# Patient Record
Sex: Female | Born: 1990 | Race: Black or African American | Hispanic: No | Marital: Single | Smoking: Never smoker
Health system: Southern US, Community
[De-identification: ages and names within clinical notes are randomized; demographics above are authoritative.]

## PROBLEM LIST (undated history)

## (undated) DIAGNOSIS — I10 Essential (primary) hypertension: Secondary | ICD-10-CM

---
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
## 2012-11-26 ENCOUNTER — Encounter (HOSPITAL_COMMUNITY): Payer: Self-pay | Admitting: Emergency Medicine

## 2012-11-26 ENCOUNTER — Emergency Department (HOSPITAL_COMMUNITY)
Admission: EM | Admit: 2012-11-26 | Discharge: 2012-11-26 | Disposition: A | Discharge status: Home / Self Care | Payer: BC Managed Care – PPO | Attending: Emergency Medicine | Admitting: Emergency Medicine

## 2012-11-26 DIAGNOSIS — R6883 Chills (without fever): Secondary | ICD-10-CM | POA: Insufficient documentation

## 2012-11-26 DIAGNOSIS — J029 Acute pharyngitis, unspecified: Secondary | ICD-10-CM | POA: Insufficient documentation

## 2012-11-26 DIAGNOSIS — R51 Headache: Secondary | ICD-10-CM | POA: Insufficient documentation

## 2012-11-26 DIAGNOSIS — L299 Pruritus, unspecified: Secondary | ICD-10-CM | POA: Insufficient documentation

## 2012-11-26 DIAGNOSIS — J3489 Other specified disorders of nose and nasal sinuses: Secondary | ICD-10-CM | POA: Insufficient documentation

## 2012-11-26 DIAGNOSIS — R21 Rash and other nonspecific skin eruption: Secondary | ICD-10-CM | POA: Insufficient documentation

## 2012-11-26 MED ORDER — DIPHENHYDRAMINE HCL 25 MG PO CAPS
25.0000 mg | ORAL_CAPSULE | Freq: Once | ORAL | Status: AC
  Administered 2012-11-26: 25 mg via ORAL
  Filled 2012-11-26: qty 1

## 2012-11-26 MED ORDER — PREDNISONE 20 MG PO TABS
60.0000 mg | ORAL_TABLET | Freq: Once | ORAL | Status: AC
  Administered 2012-11-26: 60 mg via ORAL
  Filled 2012-11-26: qty 3

## 2012-11-26 MED ORDER — DIPHENHYDRAMINE HCL 25 MG PO TABS: 25.0000 mg | ORAL_TABLET | Freq: Four times a day (QID) | ORAL | Status: DC

## 2012-11-26 MED ORDER — PERMETHRIN 5 % EX CREA: TOPICAL_CREAM | CUTANEOUS | Status: DC

## 2012-11-26 MED ORDER — HYDROCORTISONE 1 % EX CREA: TOPICAL_CREAM | CUTANEOUS | Status: DC

## 2012-11-26 NOTE — ED Notes (Signed)
PT. REPORTS RASHES AT INNER THIGHS AND FEET ONSET LAST Monday. ALSO REPORTS HEADACHE AND NASAL CONGESTION .

## 2012-11-26 NOTE — ED Provider Notes (Signed)
History     CSN: [id number]  Arrival date & time 11/26/12  4098   First MD Initiated Contact with Patient 11/26/12 0515      Chief Complaint  Patient presents with  . Rash   HPI  History provided by the patient. Patient is 22 year old female with no significant PMH who presents with concerns for persistent pruritic rash to lower extremities below some feelings of mild headache and chills. Patient reports having some symptoms of nasal congestion, rhinorrhea slight sore throat last week. She's been taking Sudafed for the past 5-6 days and reports improvement of symptoms. On Monday 2 days ago she developed a pruritic rash around her ankles and feet as well as her medial thighs and groin area. She is not use any treatments for the rash and symptoms have been persistent. She does report that the rash feels more pruritic at night. No other members in the household have similar symptoms. She is not traveling recently. She has recently changed somebody soap but this is the only area affected with rash. She does report visiting a friend's house the day before rash symptoms began. She is unsure if anyone at that house had any symptoms. She denies any fever. Denies any neck pain or stiffness. No other aggravating or alleviating factors. No other associated symptoms.    History reviewed. No pertinent past medical history.  History reviewed. No pertinent past surgical history.  No family history on file.  History  Substance Use Topics  . Smoking status: Never Smoker   . Smokeless tobacco: Not on file  . Alcohol Use: Yes    OB History   Grav Para Term Preterm Abortions TAB SAB Ect Mult Living                  Review of Systems  Constitutional: Positive for chills. Negative for fever.  HENT: Positive for congestion, sore throat and rhinorrhea. Negative for neck pain and neck stiffness.   Respiratory: Negative for cough.   Gastrointestinal: Negative for nausea, vomiting, abdominal pain and  diarrhea.  Skin: Positive for rash.  Neurological: Positive for headaches.  All other systems reviewed and are negative.    Allergies  Review of patient's allergies indicates no known allergies.  Home Medications  No current outpatient prescriptions on file.  BP 144/99  Pulse 79  Temp(Src) 98.2 F (36.8 C) (Oral)  Resp 14  SpO2 97%  LMP 11/23/2012  Physical Exam  Nursing note and vitals reviewed. Constitutional: She is oriented to person, place, and time. She appears well-developed and well-nourished. No distress.  HENT:  Head: Normocephalic.  Mouth/Throat: Oropharynx is clear and moist.  Eyes: Conjunctivae are normal.  Neck: Normal range of motion. Neck supple.  Cardiovascular: Normal rate and regular rhythm.   Pulmonary/Chest: Effort normal and breath sounds normal. No stridor. No respiratory distress. She has no wheezes. She has no rales.  Abdominal: Soft.  Musculoskeletal: Normal range of motion. She exhibits no edema.  Lymphadenopathy:    She has no cervical adenopathy.  Neurological: She is alert and oriented to person, place, and time.  Skin: Skin is warm and dry. No rash noted.  Erythematous papular rash to the bilateral lower legs and dorsal feet. Few similar spots up to the legs. Have your rash towards the medial aspects of bilateral thighs. No large areas of erythema or swelling. No induration. No erythematous streaks.  Remaining skin appears normal. No rash to the soles or palms.  Psychiatric: She has a normal mood  and affect. Her behavior is normal.    ED Course  Procedures     1. Rash       5:20 AM patient seen and evaluated. Patient appears well in no acute distress. She does not appear severely ill or toxic. No meningeal signs.        Angus Seller, PA-C 11/26/12 [phone]

## 2012-11-26 NOTE — ED Provider Notes (Signed)
Medical screening examination/treatment/procedure(s) were performed by non-physician practitioner and as supervising physician I was immediately available for consultation/collaboration.  Sunnie Nielsen, MD 11/26/12 [phone]

## 2013-06-06 ENCOUNTER — Emergency Department (HOSPITAL_COMMUNITY)
Admission: EM | Admit: 2013-06-06 | Discharge: 2013-06-06 | Disposition: A | Discharge status: Home / Self Care | Payer: BC Managed Care – PPO | Attending: Emergency Medicine | Admitting: Emergency Medicine

## 2013-06-06 ENCOUNTER — Encounter (HOSPITAL_COMMUNITY): Payer: Self-pay | Admitting: *Deleted

## 2013-06-06 ENCOUNTER — Emergency Department (HOSPITAL_COMMUNITY): Payer: BC Managed Care – PPO

## 2013-06-06 DIAGNOSIS — R071 Chest pain on breathing: Secondary | ICD-10-CM | POA: Insufficient documentation

## 2013-06-06 DIAGNOSIS — R079 Chest pain, unspecified: Secondary | ICD-10-CM

## 2013-06-06 DIAGNOSIS — F17200 Nicotine dependence, unspecified, uncomplicated: Secondary | ICD-10-CM | POA: Insufficient documentation

## 2013-06-06 LAB — POCT I-STAT, CHEM 8
BUN: 12 mg/dL (ref 6–23)
Calcium, Ion: 1.21 mmol/L (ref 1.12–1.23)
Chloride: 101 meq/L (ref 96–112)
Creatinine, Ser: 1.1 mg/dL (ref 0.50–1.10)
Glucose, Bld: 93 mg/dL (ref 70–99)
HCT: 37 % (ref 36.0–46.0)
Hemoglobin: 12.6 g/dL (ref 12.0–15.0)
Potassium: 3.6 meq/L (ref 3.5–5.1)
Sodium: 141 meq/L (ref 135–145)
TCO2: 27 mmol/L (ref 0–100)

## 2013-06-06 LAB — CBC
Hemoglobin: 12.1 g/dL (ref 12.0–15.0)
MCH: 30.3 pg (ref 26.0–34.0)
MCHC: 33.8 g/dL (ref 30.0–36.0)
RDW: 12.6 % (ref 11.5–15.5)

## 2013-06-06 LAB — TROPONIN I: Troponin I: <0.3 ng/mL (ref <0.30)

## 2013-06-06 MED ORDER — KETOROLAC TROMETHAMINE 30 MG/ML IJ SOLN
30.0000 mg | Freq: Once | INTRAMUSCULAR | Status: AC
  Administered 2013-06-06: 30 mg via INTRAVENOUS
  Filled 2013-06-06: qty 1

## 2013-06-06 MED ORDER — IBUPROFEN 800 MG PO TABS: 800.0000 mg | ORAL_TABLET | Freq: Three times a day (TID) | ORAL | Status: DC

## 2013-06-06 NOTE — ED Provider Notes (Signed)
CSN: [id number]     Arrival date & time 06/06/13  0032 History   First MD Initiated Contact with Patient 06/06/13 0100     Chief Complaint  Patient presents with  . Chest Pain   (Consider location/radiation/quality/duration/timing/severity/associated sxs/prior Treatment) Patient is a 22 y.o. female presenting with chest pain.  Chest Pain Associated symptoms: no abdominal pain, no back pain, no fever, no headache, no numbness, no shortness of breath and no weakness    History provided by patient. Woke up around 10:30 PM with right-sided chest pain, mild to moderate severity, constant, dull in quality. No associated diaphoresis, shortness of breath, nausea, leg pain or leg swelling. She denies any recalled trauma but she does lift heavy boxes at work. No known aggravating or alleviating factors. She is a smoker and admits to marijuana use. No history of DVT or PE. She is not on birth control pills. No history of same. At this time she declines any pain medications. Pain is minimal. She initially had some radiation of pain into her right arm but denies any this time. No back pain. No cough, fevers or recent illness.  History reviewed. No pertinent past medical history. History reviewed. No pertinent past surgical history. History reviewed. No pertinent family history. History  Substance Use Topics  . Smoking status: Current Some Day Smoker  . Smokeless tobacco: Not on file  . Alcohol Use: Yes   OB History   Grav Para Term Preterm Abortions TAB SAB Ect Mult Living                 Review of Systems  Constitutional: Negative for fever and chills.  HENT: Negative for neck pain and neck stiffness.   Respiratory: Negative for shortness of breath.   Cardiovascular: Positive for chest pain.  Gastrointestinal: Negative for abdominal pain.  Genitourinary: Negative for flank pain.  Musculoskeletal: Negative for back pain.  Skin: Negative for rash.  Neurological: Negative for weakness, numbness  and headaches.  All other systems reviewed and are negative.    Allergies  Review of patient's allergies indicates no known allergies.  Home Medications   Current Outpatient Rx  Name  Route  Sig  Dispense  Refill  . diphenhydrAMINE (BENADRYL) 25 MG tablet   Oral   Take 1 tablet (25 mg total) by mouth every 6 (six) hours.   20 tablet   0   . hydrocortisone cream 1 %      Apply to affected area 2 times daily   15 g   0   . permethrin (ELIMITE) 5 % cream      Apply to affected areas from the neck down and leave on the skin for 8-14 hours then wash off once. Do not reapply for 7 days.   60 g   0   . pseudoephedrine (SUDAFED) 30 MG tablet   Oral   Take 30 mg by mouth every 6 (six) hours as needed for congestion.          BP 146/86  Pulse 80  Temp(Src) 98.6 F (37 C) (Oral)  Resp 18  SpO2 99%  LMP 06/03/2013 Physical Exam  Constitutional: She is oriented to person, place, and time. She appears well-developed and well-nourished.  HENT:  Head: Normocephalic and atraumatic.  Eyes: EOM are normal. Pupils are equal, round, and reactive to light.  Neck: Neck supple.  Cardiovascular: Normal rate, regular rhythm and intact distal pulses.   Pulmonary/Chest: Effort normal and breath sounds normal. No respiratory distress.  She exhibits no tenderness.  Localizes pain to R anterior chest wall, no rash or crepitus, no reproducible tenderness  Abdominal: Soft. There is no tenderness.  Musculoskeletal: Normal range of motion. She exhibits no edema.  Neurological: She is alert and oriented to person, place, and time.  Skin: Skin is warm and dry.    ED Course  Procedures (including critical care time) Labs Review Labs Reviewed  CBC - Abnormal; Notable for the following:    HCT 35.8 (*)    All other components within normal limits  TROPONIN I  D-DIMER, QUANTITATIVE  POCT I-STAT, CHEM 8   Imaging Review Dg Chest 2 View  06/06/2013   CLINICAL DATA:  Right-sided chest  pain.  EXAM: CHEST  2 VIEW  COMPARISON:  None.  FINDINGS: The heart size and mediastinal contours are within normal limits. Both lungs are clear. The visualized skeletal structures are unremarkable.  IMPRESSION: No active cardiopulmonary disease.   Electronically Signed   By: Tiburcio Pea M.D.   On: 06/06/2013 01:23     Date: 06/06/2013  Rate: 78  Rhythm: normal sinus rhythm  QRS Axis: normal  Intervals: normal  ST/T Wave abnormalities: nonspecific ST changes  Conduction Disutrbances:none  Narrative Interpretation:   Old EKG Reviewed: none available  IV Toradol  Pain improving. Plan discharge home with outpatient followup. Prescription for Motrin provided. Return precautions verbalized as understood. patient agrees to return to the emergency department any fevers, trouble breathing or return chest pain  MDM  Diagnosis: Chest pain  EKG, labs, chest x-ray  Improved with medications Vital signs and nursing notes reviewed   Sunnie Nielsen, MD 06/06/13 [phone]

## 2013-06-06 NOTE — ED Notes (Signed)
Patient states that when she awoke today that she had a discomfort in her right chest radiating down her right arm with and without movement. She denies nausea or being diaphoretic, but some nausea associated with pain. Patient denies taking medication for the pain. Pain became progressively worse and she presents to the ED.

## 2013-09-19 ENCOUNTER — Emergency Department (HOSPITAL_COMMUNITY)
Admission: EM | Admit: 2013-09-19 | Discharge: 2013-09-19 | Discharge status: Against Medical Advice | Payer: BC Managed Care – PPO | Attending: Emergency Medicine | Admitting: Emergency Medicine

## 2013-09-19 ENCOUNTER — Encounter (HOSPITAL_COMMUNITY): Payer: Self-pay | Admitting: Emergency Medicine

## 2013-09-19 DIAGNOSIS — F17200 Nicotine dependence, unspecified, uncomplicated: Secondary | ICD-10-CM | POA: Insufficient documentation

## 2013-09-19 DIAGNOSIS — R1110 Vomiting, unspecified: Secondary | ICD-10-CM | POA: Insufficient documentation

## 2013-09-19 NOTE — ED Notes (Signed)
Pt arrived to the ED with a complaint of a headache with emesis.  Pt states she has had influenza type symptoms for a couple of hours.  Pt has diarrhea, emesis and a headache.  Pt had 3 episodes of emesis, and 2 episodes of diarrhea.

## 2014-08-19 ENCOUNTER — Encounter (HOSPITAL_COMMUNITY): Payer: Self-pay

## 2014-08-19 ENCOUNTER — Emergency Department (HOSPITAL_COMMUNITY)
Admission: EM | Admit: 2014-08-19 | Discharge: 2014-08-19 | Disposition: A | Discharge status: Home / Self Care | Payer: BC Managed Care – PPO | Attending: Emergency Medicine | Admitting: Emergency Medicine

## 2014-08-19 DIAGNOSIS — F419 Anxiety disorder, unspecified: Secondary | ICD-10-CM | POA: Diagnosis present

## 2014-08-19 DIAGNOSIS — R000 Tachycardia, unspecified: Secondary | ICD-10-CM | POA: Diagnosis not present

## 2014-08-19 DIAGNOSIS — Z720 Tobacco use: Secondary | ICD-10-CM | POA: Insufficient documentation

## 2014-08-19 DIAGNOSIS — F1210 Cannabis abuse, uncomplicated: Secondary | ICD-10-CM | POA: Diagnosis not present

## 2014-08-19 DIAGNOSIS — F1810 Inhalant abuse, uncomplicated: Secondary | ICD-10-CM

## 2014-08-19 LAB — RAPID URINE DRUG SCREEN, HOSP PERFORMED
Amphetamines: NOT DETECTED
BARBITURATES: NOT DETECTED
BENZODIAZEPINES: NOT DETECTED
COCAINE: NOT DETECTED
OPIATES: NOT DETECTED
Tetrahydrocannabinol: NOT DETECTED

## 2014-08-19 NOTE — ED Provider Notes (Signed)
CSN: [id number]     Arrival date & time 08/19/14  1310 History   First MD Initiated Contact with Patient 08/19/14 1341     Chief Complaint  Patient presents with  . Anxiety    HPI  Ms. Janet Velasquez is a 23 year old female who presents today with agitation. She reports smoking some marijuana she picked up from a new dealer earlier today. About 15 minutes later, she reported feeling agitated and restless, like "she wanted to run." She feels her heart also started beating really fast. Otherwise, she denies recent illness, visual hallucinations, nausea, vomiting, diarrhea, chest pain, shortness of breath, prior episode of similar symptoms, other associated alcohol or drug use.   History reviewed. No pertinent past medical history. History reviewed. No pertinent past surgical history. History reviewed. No pertinent family history. History  Substance Use Topics  . Smoking status: Current Some Day Smoker  . Smokeless tobacco: Not on file  . Alcohol Use: Yes     Comment: social   OB History    No data available     Review of Systems  Respiratory: Negative for shortness of breath.   Cardiovascular: Negative for chest pain.  Gastrointestinal: Negative for nausea, vomiting, abdominal pain and diarrhea.  Psychiatric/Behavioral: Positive for agitation. Negative for hallucinations.      Allergies  Review of patient's allergies indicates no known allergies.  Home Medications   Prior to Admission medications   Not on File   BP 141/90 mmHg  Pulse 98  Temp(Src) 98.1 F (36.7 C) (Oral)  Resp 18  SpO2 99%  LMP 07/27/2014 Physical Exam  Constitutional: She is oriented to person, place, and time. She appears well-developed and well-nourished. No distress.  HENT:  Head: Normocephalic and atraumatic.  Mouth/Throat: Oropharynx is clear and moist. No oropharyngeal exudate.  Eyes: Conjunctivae are normal. Pupils are equal, round, and reactive to light.  Cardiovascular: Normal rate and regular  rhythm.  Exam reveals no gallop and no friction rub.   No murmur heard. Pulmonary/Chest: Effort normal and breath sounds normal. No respiratory distress. She has no wheezes. She has no rales.  Abdominal: Soft. Bowel sounds are normal. She exhibits no distension. There is no tenderness. There is no rebound.  Neurological: She is alert and oriented to person, place, and time. No cranial nerve deficit.  CN II-XII intact, 5/5 upper and lower extremity strength, 2+ grip strength, finger to nose & rapid alternating movements intact    Skin: She is not diaphoretic.    ED Course  Procedures (including critical care time) Labs Review Labs Reviewed  URINE RAPID DRUG SCREEN (HOSP PERFORMED)    Imaging Review No results found.   EKG Interpretation None      MDM   Final diagnoses:  Tachycardia  Abuse of smoked substance    Hemodynamically stable with tachycardia improving. Unsure what she may have smoked as UDS is without marijuana. Advised her to be cautious about substance abuse.     Heywood Ilesushil Patel, MD 08/19/14 1500  Juliet RudeNathan R. Rubin PayorPickering, MD 08/19/14 1526

## 2014-08-19 NOTE — ED Notes (Signed)
Per EMS, Pt, from home, c/o anxiety and "heart racing" after smoking marijuana.  Denies pain.  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

## 2014-11-10 ENCOUNTER — Emergency Department (HOSPITAL_COMMUNITY): Payer: BLUE CROSS/BLUE SHIELD

## 2014-11-10 ENCOUNTER — Emergency Department (HOSPITAL_COMMUNITY)
Admission: EM | Admit: 2014-11-10 | Discharge: 2014-11-10 | Disposition: A | Discharge status: Home / Self Care | Payer: BLUE CROSS/BLUE SHIELD | Attending: Emergency Medicine | Admitting: Emergency Medicine

## 2014-11-10 ENCOUNTER — Encounter (HOSPITAL_COMMUNITY): Payer: Self-pay | Admitting: Emergency Medicine

## 2014-11-10 DIAGNOSIS — M549 Dorsalgia, unspecified: Secondary | ICD-10-CM | POA: Insufficient documentation

## 2014-11-10 DIAGNOSIS — Z87891 Personal history of nicotine dependence: Secondary | ICD-10-CM | POA: Diagnosis not present

## 2014-11-10 DIAGNOSIS — R079 Chest pain, unspecified: Secondary | ICD-10-CM | POA: Diagnosis present

## 2014-11-10 LAB — COMPREHENSIVE METABOLIC PANEL
ALT: 13 U/L (ref 0–35)
ANION GAP: 4 — AB (ref 5–15)
AST: 16 U/L (ref 0–37)
Albumin: 4 g/dL (ref 3.5–5.2)
Alkaline Phosphatase: 42 U/L (ref 39–117)
BUN: 12 mg/dL (ref 6–23)
CHLORIDE: 103 mmol/L (ref 96–112)
CO2: 27 mmol/L (ref 19–32)
Calcium: 8.4 mg/dL (ref 8.4–10.5)
Creatinine, Ser: 0.73 mg/dL (ref 0.50–1.10)
GFR calc Af Amer: >90 mL/min (ref >90)
GFR calc non Af Amer: >90 mL/min (ref >90)
Glucose, Bld: 93 mg/dL (ref 70–99)
POTASSIUM: 3.7 mmol/L (ref 3.5–5.1)
SODIUM: 134 mmol/L — AB (ref 135–145)
Total Bilirubin: 1 mg/dL (ref 0.3–1.2)
Total Protein: 7.3 g/dL (ref 6.0–8.3)

## 2014-11-10 LAB — CBC WITH DIFFERENTIAL/PLATELET
BASOS PCT: 0 % (ref 0–1)
Basophils Absolute: 0 10*3/uL (ref 0.0–0.1)
EOS ABS: 0.1 10*3/uL (ref 0.0–0.7)
Eosinophils Relative: 1 % (ref 0–5)
HEMATOCRIT: 36.2 % (ref 36.0–46.0)
HEMOGLOBIN: 12.2 g/dL (ref 12.0–15.0)
Lymphocytes Relative: 37 % (ref 12–46)
Lymphs Abs: 3.1 10*3/uL (ref 0.7–4.0)
MCH: 31 pg (ref 26.0–34.0)
MCHC: 33.7 g/dL (ref 30.0–36.0)
MCV: 92.1 fL (ref 78.0–100.0)
Monocytes Absolute: 0.7 10*3/uL (ref 0.1–1.0)
Monocytes Relative: 8 % (ref 3–12)
NEUTROS ABS: 4.4 10*3/uL (ref 1.7–7.7)
NEUTROS PCT: 54 % (ref 43–77)
Platelets: 269 10*3/uL (ref 150–400)
RBC: 3.93 MIL/uL (ref 3.87–5.11)
RDW: 13.4 % (ref 11.5–15.5)
WBC: 8.2 10*3/uL (ref 4.0–10.5)

## 2014-11-10 LAB — D-DIMER, QUANTITATIVE (NOT AT ARMC)

## 2014-11-10 LAB — TROPONIN I: Troponin I: <0.03 ng/mL (ref <0.031)

## 2014-11-10 LAB — HCG, SERUM, QUALITATIVE: Preg, Serum: NEGATIVE

## 2014-11-10 MED ORDER — IBUPROFEN 600 MG PO TABS: 600.0000 mg | ORAL_TABLET | Freq: Four times a day (QID) | ORAL | Status: DC | PRN

## 2014-11-10 MED ORDER — KETOROLAC TROMETHAMINE 60 MG/2ML IM SOLN
60.0000 mg | Freq: Once | INTRAMUSCULAR | Status: DC
  Filled 2014-11-10: qty 2

## 2014-11-10 NOTE — ED Notes (Signed)
Went into patients room to discharge patient. Patient was not in room at this time.

## 2014-11-10 NOTE — ED Notes (Signed)
Patient not in room, when this writer went into room to discharge her.

## 2014-11-10 NOTE — Discharge Instructions (Signed)

## 2014-11-10 NOTE — ED Notes (Signed)
Patient states chest pain that comes and goes started yesterday. Describes pain as a dull ache that was reduced with motrin.

## 2014-11-10 NOTE — ED Notes (Addendum)
Pt c/o right upper chest aching onset yesterday while sitting on couch. Pt states pain does radiate to back at times, was unable to get comfortable tonight. Intermittent SHOB. Pt states she took Motrin @ 1800 which relieved the pain but pain returned

## 2014-11-10 NOTE — ED Provider Notes (Signed)
CSN: [id number]     Arrival date & time 11/10/14  7829 History   First MD Initiated Contact with Patient 11/10/14 0345     Chief Complaint  Patient presents with  . Chest Pain     (Consider location/radiation/quality/duration/timing/severity/associated sxs/prior Treatment) HPI Patient presents with intermittent right-sided chest pain for the last several months. Discussed the pain as dull in nature. She's been seen in the emergency department before for similar pain. She denies any shortness of breath. She denies any cough, fever or chills. She's had no lower extremity swelling or pain. No recent surgeries or extended travel. No family history of PE/DVT. Denies any chest trauma. Patient states pain in her right upper chest radiates to her right upper back and to her right upper extremity. No focal weakness or numbness History reviewed. No pertinent past medical history. History reviewed. No pertinent past surgical history. No family history on file. History  Substance Use Topics  . Smoking status: Former Games developer  . Smokeless tobacco: Not on file  . Alcohol Use: Yes     Comment: social   OB History    No data available     Review of Systems  Constitutional: Negative for fever and chills.  Respiratory: Negative for cough and shortness of breath.   Cardiovascular: Positive for chest pain. Negative for palpitations and leg swelling.  Gastrointestinal: Negative for nausea, vomiting, abdominal pain, diarrhea and constipation.  Musculoskeletal: Positive for back pain. Negative for neck pain and neck stiffness.  Skin: Negative for rash and wound.  Neurological: Negative for dizziness, weakness, light-headedness, numbness and headaches.  All other systems reviewed and are negative.     Allergies  Review of patient's allergies indicates no known allergies.  Home Medications   Prior to Admission medications   Medication Sig Start Date End Date Taking? Authorizing Provider  ibuprofen  (ADVIL,MOTRIN) 200 MG tablet Take 200 mg by mouth every 6 (six) hours as needed (for pain).   Yes Historical Provider, MD   BP 142/88 mmHg  Pulse 85  Temp(Src) 98 F (36.7 C) (Oral)  Resp 18  Ht  (1.778 m)  Wt 220 lb (99.791 kg)  BMI 31.57 kg/m2  SpO2 100%  LMP 10/28/2014 Physical Exam  Constitutional: She is oriented to person, place, and time. She appears well-developed and well-nourished. No distress.  HENT:  Head: Normocephalic and atraumatic.  Mouth/Throat: Oropharynx is clear and moist.  Eyes: EOM are normal. Pupils are equal, round, and reactive to light.  Neck: Normal range of motion. Neck supple.  Cardiovascular: Normal rate and regular rhythm.  Exam reveals no gallop and no friction rub.   No murmur heard. Pulmonary/Chest: Effort normal and breath sounds normal. No respiratory distress. She has no wheezes. She has no rales. She exhibits no tenderness.  Abdominal: Soft. Bowel sounds are normal. She exhibits no distension and no mass. There is no tenderness. There is no rebound and no guarding.  Musculoskeletal: Normal range of motion. She exhibits no edema or tenderness.  No thoracic or lumbar tenderness with palpation. Distal pulses intact. No calf swelling or tenderness.  Neurological: She is alert and oriented to person, place, and time.  5/5 motor in all extremities. Sensation fully intact.  Skin: Skin is warm and dry. No rash noted. No erythema.  Psychiatric: She has a normal mood and affect. Her behavior is normal.  Nursing note and vitals reviewed.   ED Course  Procedures (including critical care time) Labs Review Labs Reviewed  CBC WITH  DIFFERENTIAL/PLATELET  COMPREHENSIVE METABOLIC PANEL  TROPONIN I  D-DIMER, QUANTITATIVE  HCG, SERUM, QUALITATIVE    Imaging Review No results found.   EKG Interpretation None      MDM   Final diagnoses:  Chest pain   low suspicion for PE/CAD. EKG without any acute findings. Will treat with NSAIDs and screen  for PE.   Chest pain workup without any acute findings. EKG, troponin, d-dimer and chest x-ray. Patient refused any pain medication. Admits to history of lifting heavy boxes which may be contributing to her symptoms. We'll discharge home with prescription for NSAIDs. Advised heat as needed. Return precautions given.    Loren Raceravid Constance Whittle, MD 11/10/14 [phone]

## 2015-05-18 ENCOUNTER — Emergency Department (HOSPITAL_COMMUNITY)
Admission: EM | Admit: 2015-05-18 | Discharge: 2015-05-18 | Disposition: A | Discharge status: Home / Self Care | Payer: BLUE CROSS/BLUE SHIELD | Attending: Emergency Medicine | Admitting: Emergency Medicine

## 2015-05-18 ENCOUNTER — Encounter (HOSPITAL_COMMUNITY): Payer: Self-pay | Admitting: Emergency Medicine

## 2015-05-18 DIAGNOSIS — R05 Cough: Secondary | ICD-10-CM

## 2015-05-18 DIAGNOSIS — Z87891 Personal history of nicotine dependence: Secondary | ICD-10-CM | POA: Insufficient documentation

## 2015-05-18 DIAGNOSIS — J029 Acute pharyngitis, unspecified: Secondary | ICD-10-CM

## 2015-05-18 DIAGNOSIS — R059 Cough, unspecified: Secondary | ICD-10-CM

## 2015-05-18 LAB — RAPID STREP SCREEN (MED CTR MEBANE ONLY): STREPTOCOCCUS, GROUP A SCREEN (DIRECT): NEGATIVE

## 2015-05-18 MED ORDER — DEXTROMETHORPHAN-MENTHOL 5-5 MG MT LOZG: 1.0000 | LOZENGE | Freq: Three times a day (TID) | OROMUCOSAL | Status: DC

## 2015-05-18 NOTE — Discharge Instructions (Signed)
Your strep test is negative. °

## 2015-05-18 NOTE — ED Notes (Signed)
Pt c/o sore throat x2 days, cough with yellow sputum. Denies fever.

## 2015-05-18 NOTE — ED Provider Notes (Signed)
CSN: [id number]   Arrival date & time 05/18/15 1924  History  This chart was scribed for non-physician practitioner, Earley Favor, NP working with Leta Baptist, MD by Bethel Born, ED Scribe. This patient was seen in room WTR5/WTR5 and the patient's care was started at 8:32 PM.  Chief Complaint  Patient presents with  . Sore Throat  . Cough    HPI The history is provided by the patient. No language interpreter was used.   Janet Velasquez is a 24 y.o. female who presents to the Emergency Department complaining of a constant sore throat with onset 2 days ago. Pt rates the pain 3/10 in severity. OTC throat lozenges provided mild relief PTA. Associated symptoms include cough productive of yellow sputum, nasal congestion, and chest tightness. Pt denies fever. No history of seasonal allergies.   History reviewed. No pertinent past medical history.  History reviewed. No pertinent past surgical history.  No family history on file.  Social History  Substance Use Topics  . Smoking status: Former Games developer  . Smokeless tobacco: None  . Alcohol Use: Yes     Comment: social     Review of Systems  Constitutional: Negative for fever.  HENT: Positive for congestion and sore throat.   Respiratory: Positive for cough and chest tightness.     Home Medications   Prior to Admission medications   Medication Sig Start Date End Date Taking? Authorizing Provider  Dextromethorphan-Menthol (DELSYM COUGH+ SOOTHING ACTION) 5-5 MG LOZG Use as directed 1 lozenge in the mouth or throat 3 (three) times daily. 05/18/15   Earley Favor, NP  ibuprofen (ADVIL,MOTRIN) 600 MG tablet Take 1 tablet (600 mg total) by mouth every 6 (six) hours as needed for moderate pain (for pain). 11/10/14   Loren Racer, MD    Allergies  Review of patient's allergies indicates no known allergies.  Triage Vitals: BP 145/90 mmHg  Pulse 77  Temp(Src) 98.1 F (36.7 C) (Oral)  Resp 20  SpO2 99%  LMP 05/01/2015  Physical Exam   Constitutional: She appears well-developed and well-nourished.  HENT:  Head: Normocephalic and atraumatic.  Right Ear: External ear normal.  Left Ear: External ear normal.  Eyes: Pupils are equal, round, and reactive to light.  Cardiovascular: Normal rate and regular rhythm.   Pulmonary/Chest: Effort normal and breath sounds normal. No respiratory distress. She has no wheezes. She has no rales. She exhibits no tenderness.  Musculoskeletal: Normal range of motion.  Lymphadenopathy:    She has no cervical adenopathy.  Neurological: She is alert.  Skin: Skin is warm and dry.  Nursing note and vitals reviewed.   ED Course  Procedures  DIAGNOSTIC STUDIES: Oxygen Saturation is 99% on RA,  normal by my interpretation.    COORDINATION OF CARE: 8:35 PM Discussed treatment plan which includes strep screen and supportive care with pt at bedside and pt agreed to plan.  Labs Review-  Labs Reviewed  RAPID STREP SCREEN (NOT AT [facility])  CULTURE, GROUP A STREP    Imaging Review No results found.     Strep test is negative MDM   Final diagnoses:  Pharyngitis  Cough    I personally performed the services described in this documentation, which was scribed in my presence. The recorded information has been reviewed and is accurate.     Earley Favor, NP 05/18/15 2130  Leta Baptist, MD 05/20/15 [phone]

## 2015-05-20 LAB — CULTURE, GROUP A STREP

## 2015-09-02 ENCOUNTER — Emergency Department (HOSPITAL_COMMUNITY)
Admission: EM | Admit: 2015-09-02 | Discharge: 2015-09-02 | Disposition: A | Discharge status: Home / Self Care | Payer: Managed Care, Other (non HMO) | Attending: Emergency Medicine | Admitting: Emergency Medicine

## 2015-09-02 ENCOUNTER — Emergency Department (HOSPITAL_COMMUNITY): Payer: Managed Care, Other (non HMO)

## 2015-09-02 DIAGNOSIS — Z87891 Personal history of nicotine dependence: Secondary | ICD-10-CM | POA: Insufficient documentation

## 2015-09-02 DIAGNOSIS — Z3202 Encounter for pregnancy test, result negative: Secondary | ICD-10-CM | POA: Insufficient documentation

## 2015-09-02 DIAGNOSIS — R0602 Shortness of breath: Secondary | ICD-10-CM | POA: Insufficient documentation

## 2015-09-02 DIAGNOSIS — M25511 Pain in right shoulder: Secondary | ICD-10-CM | POA: Insufficient documentation

## 2015-09-02 DIAGNOSIS — R079 Chest pain, unspecified: Secondary | ICD-10-CM | POA: Insufficient documentation

## 2015-09-02 LAB — PREGNANCY, URINE: Preg Test, Ur: NEGATIVE

## 2015-09-02 LAB — CBC
HCT: 36.3 % (ref 36.0–46.0)
Hemoglobin: 12.3 g/dL (ref 12.0–15.0)
MCH: 30.4 pg (ref 26.0–34.0)
MCHC: 33.9 g/dL (ref 30.0–36.0)
MCV: 89.9 fL (ref 78.0–100.0)
Platelets: 317 10*3/uL (ref 150–400)
RBC: 4.04 MIL/uL (ref 3.87–5.11)
RDW: 13 % (ref 11.5–15.5)
WBC: 9.1 10*3/uL (ref 4.0–10.5)

## 2015-09-02 LAB — BASIC METABOLIC PANEL
Anion gap: 10 (ref 5–15)
BUN: 8 mg/dL (ref 6–20)
CO2: 25 mmol/L (ref 22–32)
Calcium: 9.1 mg/dL (ref 8.9–10.3)
Chloride: 107 mmol/L (ref 101–111)
Creatinine, Ser: 0.9 mg/dL (ref 0.44–1.00)
GFR calc Af Amer: >60 mL/min (ref >60)
GFR calc non Af Amer: >60 mL/min (ref >60)
Glucose, Bld: 107 mg/dL — ABNORMAL HIGH (ref 65–99)
Potassium: 3.3 mmol/L — ABNORMAL LOW (ref 3.5–5.1)
Sodium: 142 mmol/L (ref 135–145)

## 2015-09-02 LAB — I-STAT TROPONIN, ED: Troponin i, poc: 0 ng/mL (ref 0.00–0.08)

## 2015-09-02 LAB — D-DIMER, QUANTITATIVE (NOT AT ARMC): D-Dimer, Quant: <0.27 ug/mL-FEU (ref 0.00–0.50)

## 2015-09-02 MED ORDER — POTASSIUM CHLORIDE CRYS ER 20 MEQ PO TBCR
40.0000 meq | EXTENDED_RELEASE_TABLET | Freq: Once | ORAL | Status: AC
  Administered 2015-09-02: 40 meq via ORAL
  Filled 2015-09-02: qty 2

## 2015-09-02 MED ORDER — KETOROLAC TROMETHAMINE 15 MG/ML IJ SOLN
15.0000 mg | Freq: Once | INTRAMUSCULAR | Status: AC
  Administered 2015-09-02: 15 mg via INTRAVENOUS
  Filled 2015-09-02: qty 1

## 2015-09-02 NOTE — ED Notes (Signed)
Patient transported to X-ray 

## 2015-09-02 NOTE — ED Notes (Signed)
Pt requesting a CT scan.  Pt informed that we typically do not perform CTs, due to the radiation unless other testing suggests that one is needed.

## 2015-09-02 NOTE — ED Notes (Signed)
Pt unable to be found when this RN went in to discharge her.  It is assumed that the Pt left prior to being discharged.

## 2015-09-02 NOTE — ED Notes (Signed)
Pt c/o intermittent right sided chest pain and SOB radiating to right scapular area. Seen for the same in past, no dx. States pain is worse today than in past. Endorses nausea.

## 2015-09-02 NOTE — Discharge Instructions (Signed)
Nonspecific Chest Pain  °Chest pain can be caused by many different conditions. There is always a chance that your pain could be related to something serious, such as a heart attack or a blood clot in your lungs. Chest pain can also be caused by conditions that are not life-threatening. If you have chest pain, it is very important to follow up with your health care provider. °CAUSES  °Chest pain can be caused by: °· Heartburn. °· Pneumonia or bronchitis. °· Anxiety or stress. °· Inflammation around your heart (pericarditis) or lung (pleuritis or pleurisy). °· A blood clot in your lung. °· A collapsed lung (pneumothorax). It can develop suddenly on its own (spontaneous pneumothorax) or from trauma to the chest. °· Shingles infection (varicella-zoster virus). °· Heart attack. °· Damage to the bones, muscles, and cartilage that make up your chest wall. This can include: °¨ Bruised bones due to injury. °¨ Strained muscles or cartilage due to frequent or repeated coughing or overwork. °¨ Fracture to one or more ribs. °¨ Sore cartilage due to inflammation (costochondritis). °RISK FACTORS  °Risk factors for chest pain may include: °· Activities that increase your risk for trauma or injury to your chest. °· Respiratory infections or conditions that cause frequent coughing. °· Medical conditions or overeating that can cause heartburn. °· Heart disease or family history of heart disease. °· Conditions or health behaviors that increase your risk of developing a blood clot. °· Having had chicken pox (varicella zoster). °SIGNS AND SYMPTOMS °Chest pain can feel like: °· Burning or tingling on the surface of your chest or deep in your chest. °· Crushing, pressure, aching, or squeezing pain. °· Dull or sharp pain that is worse when you move, cough, or take a deep breath. °· Pain that is also felt in your back, neck, shoulder, or arm, or pain that spreads to any of these areas. °Your chest pain may come and go, or it may stay  constant. °DIAGNOSIS °Lab tests or other studies may be needed to find the cause of your pain. Your health care provider may have you take a test called an ambulatory ECG (electrocardiogram). An ECG records your heartbeat patterns at the time the test is performed. You may also have other tests, such as: °· Transthoracic echocardiogram (TTE). During echocardiography, sound waves are used to create a picture of all of the heart structures and to look at how blood flows through your heart. °· Transesophageal echocardiogram (TEE). This is a more advanced imaging test that obtains images from inside your body. It allows your health care provider to see your heart in finer detail. °· Cardiac monitoring. This allows your health care provider to monitor your heart rate and rhythm in real time. °· Holter monitor. This is a portable device that records your heartbeat and can help to diagnose abnormal heartbeats. It allows your health care provider to track your heart activity for several days, if needed. °· Stress tests. These can be done through exercise or by taking medicine that makes your heart beat more quickly. °· Blood tests. °· Imaging tests. °TREATMENT  °Your treatment depends on what is causing your chest pain. Treatment may include: °· Medicines. These may include: °¨ Acid blockers for heartburn. °¨ Anti-inflammatory medicine. °¨ Pain medicine for inflammatory conditions. °¨ Antibiotic medicine, if an infection is present. °¨ Medicines to dissolve blood clots. °¨ Medicines to treat coronary artery disease. °· Supportive care for conditions that do not require medicines. This may include: °¨ Resting. °¨ Applying heat   or cold packs to injured areas. °¨ Limiting activities until pain decreases. °HOME CARE INSTRUCTIONS °· If you were prescribed an antibiotic medicine, finish it all even if you start to feel better. °· Avoid any activities that bring on chest pain. °· Do not use any tobacco products, including  cigarettes, chewing tobacco, or electronic cigarettes. If you need help quitting, ask your health care provider. °· Do not drink alcohol. °· Take medicines only as directed by your health care provider. °· Keep all follow-up visits as directed by your health care provider. This is important. This includes any further testing if your chest pain does not go away. °· If heartburn is the cause for your chest pain, you may be told to keep your head raised (elevated) while sleeping. This reduces the chance that acid will go from your stomach into your esophagus. °· Make lifestyle changes as directed by your health care provider. These may include: °¨ Getting regular exercise. Ask your health care provider to suggest some activities that are safe for you. °¨ Eating a heart-healthy diet. A registered dietitian can help you to learn healthy eating options. °¨ Maintaining a healthy weight. °¨ Managing diabetes, if necessary. °¨ Reducing stress. °SEEK MEDICAL CARE IF: °· Your chest pain does not go away after treatment. °· You have a rash with blisters on your chest. °· You have a fever. °SEEK IMMEDIATE MEDICAL CARE IF:  °· Your chest pain is worse. °· You have an increasing cough, or you cough up blood. °· You have severe abdominal pain. °· You have severe weakness. °· You faint. °· You have chills. °· You have sudden, unexplained chest discomfort. °· You have sudden, unexplained discomfort in your arms, back, neck, or jaw. °· You have shortness of breath at any time. °· You suddenly start to sweat, or your skin gets clammy. °· You feel nauseous or you vomit. °· You suddenly feel light-headed or dizzy. °· Your heart begins to beat quickly, or it feels like it is skipping beats. °These symptoms may represent a serious problem that is an emergency. Do not wait to see if the symptoms will go away. Get medical help right away. Call your local emergency services (911 in the [place] Do not drive yourself to the hospital. °  °This  information is not intended to replace advice given to you by your health care provider. Make sure you discuss any questions you have with your health care provider. °  °Document Released: 05/30/2005 Document Revised: 09/10/2014 Document Reviewed: 03/26/2014 °Elsevier Interactive Patient Education ©2016 [employer] Inc[employer] ° °

## 2015-09-02 NOTE — ED Provider Notes (Signed)
CSN: [id number]     Arrival date & time 09/02/15  [id number] History   First MD Initiated Contact with Patient 09/02/15 0902     Chief Complaint  Patient presents with  . Chest Pain     (Consider location/radiation/quality/duration/timing/severity/associated sxs/prior Treatment) HPI   24 year old female with chest pain. Right sided. Has been intermittent for the past several months. Again having pain today and somewhat stronger in intensity. Pain is worse with range of motion right shoulder. Sometimes feels short of breath with exertion such as walking up steps. Dyspnea doesn't necessarily correspond to episodes of pain. No fevers or chills. No cough. No unusual leg pain or swelling. No past history of DVT/PE.  No past medical history on file. No past surgical history on file. No family history on file. Social History  Substance Use Topics  . Smoking status: Former Games developermoker  . Smokeless tobacco: Not on file  . Alcohol Use: Yes     Comment: social   OB History    No data available     Review of Systems  All systems reviewed and negative, other than as noted in HPI.   Allergies  Review of patient's allergies indicates no known allergies.  Home Medications   Prior to Admission medications   Medication Sig Start Date End Date Taking? Authorizing Provider  Dextromethorphan-Menthol (DELSYM COUGH+ SOOTHING ACTION) 5-5 MG LOZG Use as directed 1 lozenge in the mouth or throat 3 (three) times daily. Patient not taking: Reported on 09/02/2015 05/18/15   Earley FavorGail Schulz, NP  ibuprofen (ADVIL,MOTRIN) 600 MG tablet Take 1 tablet (600 mg total) by mouth every 6 (six) hours as needed for moderate pain (for pain). Patient not taking: Reported on 09/02/2015 11/10/14   Loren Raceravid Yelverton, MD   BP 128/89 mmHg  Pulse 83  Temp(Src) 98.1 F (36.7 C) (Oral)  Resp 16  SpO2 99%  LMP 08/25/2015 (Approximate) Physical Exam  Constitutional: She appears well-developed and well-nourished. No distress.  HENT:   Head: Normocephalic and atraumatic.  Eyes: Conjunctivae are normal. Right eye exhibits no discharge. Left eye exhibits no discharge.  Neck: Neck supple.  Cardiovascular: Normal rate, regular rhythm and normal heart sounds.  Exam reveals no gallop and no friction rub.   No murmur heard. Pulmonary/Chest: Effort normal and breath sounds normal. No respiratory distress.  Abdominal: Soft. She exhibits no distension. There is no tenderness.  Musculoskeletal: She exhibits no edema or tenderness.  Lower extremities symmetric as compared to each other. No calf tenderness. Negative Homan's. No palpable cords.   Neurological: She is alert.  Skin: Skin is warm and dry.  Psychiatric: She has a normal mood and affect. Her behavior is normal. Thought content normal.  Nursing note and vitals reviewed.   ED Course  Procedures (including critical care time) Labs Review Labs Reviewed  BASIC METABOLIC PANEL - Abnormal; Notable for the following:    Potassium 3.3 (*)    Glucose, Bld 107 (*)    All other components within normal limits  CBC  D-DIMER, QUANTITATIVE (NOT AT [facility])  PREGNANCY, URINE  I-STAT TROPOININ, ED    Imaging Review Dg Chest 2 View  09/02/2015  CLINICAL DATA:  Chest pain for several months, shortness of breath. EXAM: CHEST  2 VIEW COMPARISON:  November 10, 2014. FINDINGS: The heart size and mediastinal contours are within normal limits. Both lungs are clear. No pneumothorax or pleural effusion is noted. The visualized skeletal structures are unremarkable. IMPRESSION: No active cardiopulmonary disease. Electronically Signed   By:  Lupita Raider, M.D.   On: 09/02/2015 09:57   I have personally reviewed and evaluated these images and lab results as part of my medical decision-making.   EKG Interpretation   Date/Time:  Friday September 02 2015 09:07:23 EST Ventricular Rate:  90 PR Interval:  150 QRS Duration: 83 QT Interval:  345 QTC Calculation: 422 R Axis:   70 Text  Interpretation:  Sinus rhythm Borderline T wave abnormalities No  significant change since last tracing Confirmed by Maxden Naji  MD, Darlina Mccaughey  ([id number]) on 09/02/2015 11:03:12 AM      MDM   Final diagnoses:  Chest pain, unspecified chest pain type  Right shoulder pain    24 rolled female with right chest and right shoulder pain. Atypical for ACS. EKG with no concerning findings. Troponin is normal. Chest x-ray without acute abnormality. D-dimer is normal. She is afebrile. No increased work of breathing. HD stable. Oxygen saturations are 99-100% on room air. Low suspicion for emergent process.    Raeford Razor, MD 09/14/15 [phone]

## 2015-09-02 NOTE — ED Notes (Addendum)
Pt c/o R side chest pain x "a couple months" and SOB starting this morning.  Pt reports pain originally started while she was at work, but increased today.  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  Sts she noticed the SOB while walking up steps.

## 2015-09-05 ENCOUNTER — Telehealth (HOSPITAL_COMMUNITY): Payer: Self-pay

## 2015-12-30 ENCOUNTER — Encounter (HOSPITAL_COMMUNITY): Payer: Self-pay

## 2015-12-30 ENCOUNTER — Emergency Department (HOSPITAL_COMMUNITY): Payer: Managed Care, Other (non HMO)

## 2015-12-30 ENCOUNTER — Emergency Department (HOSPITAL_COMMUNITY)
Admission: EM | Admit: 2015-12-30 | Discharge: 2015-12-30 | Disposition: A | Discharge status: Home / Self Care | Payer: Managed Care, Other (non HMO) | Attending: Emergency Medicine | Admitting: Emergency Medicine

## 2015-12-30 DIAGNOSIS — R109 Unspecified abdominal pain: Secondary | ICD-10-CM | POA: Diagnosis present

## 2015-12-30 DIAGNOSIS — Z791 Long term (current) use of non-steroidal anti-inflammatories (NSAID): Secondary | ICD-10-CM | POA: Diagnosis not present

## 2015-12-30 DIAGNOSIS — Z79899 Other long term (current) drug therapy: Secondary | ICD-10-CM | POA: Diagnosis not present

## 2015-12-30 DIAGNOSIS — Z87891 Personal history of nicotine dependence: Secondary | ICD-10-CM | POA: Insufficient documentation

## 2015-12-30 LAB — CBC WITH DIFFERENTIAL/PLATELET
BASOS ABS: 0 10*3/uL (ref 0.0–0.1)
Basophils Relative: 0 %
Eosinophils Absolute: 0.1 10*3/uL (ref 0.0–0.7)
Eosinophils Relative: 1 %
HCT: 32.5 % — ABNORMAL LOW (ref 36.0–46.0)
Hemoglobin: 11 g/dL — ABNORMAL LOW (ref 12.0–15.0)
LYMPHS ABS: 2.1 10*3/uL (ref 0.7–4.0)
Lymphocytes Relative: 22 %
MCH: 30.2 pg (ref 26.0–34.0)
MCHC: 33.8 g/dL (ref 30.0–36.0)
MCV: 89.3 fL (ref 78.0–100.0)
MONO ABS: 0.9 10*3/uL (ref 0.1–1.0)
Monocytes Relative: 9 %
NEUTROS ABS: 6.4 10*3/uL (ref 1.7–7.7)
Neutrophils Relative %: 68 %
PLATELETS: 424 10*3/uL — AB (ref 150–400)
RBC: 3.64 MIL/uL — AB (ref 3.87–5.11)
RDW: 12.7 % (ref 11.5–15.5)
WBC: 9.5 10*3/uL (ref 4.0–10.5)

## 2015-12-30 LAB — URINALYSIS, ROUTINE W REFLEX MICROSCOPIC
BILIRUBIN URINE: NEGATIVE
Glucose, UA: NEGATIVE mg/dL
Hgb urine dipstick: NEGATIVE
KETONES UR: NEGATIVE mg/dL
Leukocytes, UA: NEGATIVE
NITRITE: NEGATIVE
PROTEIN: NEGATIVE mg/dL
Specific Gravity, Urine: 1.03 (ref 1.005–1.030)
pH: 6.5 (ref 5.0–8.0)

## 2015-12-30 LAB — I-STAT CHEM 8, ED
BUN: 7 mg/dL (ref 6–20)
Calcium, Ion: 1.13 mmol/L (ref 1.12–1.23)
Chloride: 102 mmol/L (ref 101–111)
Creatinine, Ser: 0.8 mg/dL (ref 0.44–1.00)
GLUCOSE: 76 mg/dL (ref 65–99)
HEMATOCRIT: 35 % — AB (ref 36.0–46.0)
HEMOGLOBIN: 11.9 g/dL — AB (ref 12.0–15.0)
POTASSIUM: 3.7 mmol/L (ref 3.5–5.1)
Sodium: 140 mmol/L (ref 135–145)
TCO2: 24 mmol/L (ref 0–100)

## 2015-12-30 LAB — POC URINE PREG, ED: PREG TEST UR: NEGATIVE

## 2015-12-30 NOTE — ED Notes (Signed)
Patient c/o side/flank pain x2 weeks.  Patient states that has been having difficulty moving her bowels.  Denies blood in urine denies blood in stool.  Patient states that pain is increased when she sleeps on her side and on inspiration.  Pain became increasingly worse tonight and patient decided to come to the ED.  Patient states pain 5/10.  Breathing even and unlabored.  NAD at this time.

## 2015-12-30 NOTE — ED Provider Notes (Signed)
CSN: [id number]     Arrival date & time 12/30/15  0453 History   First MD Initiated Contact with Patient 12/30/15 [phone]     Chief Complaint  Patient presents with  . Flank Pain     (Consider location/radiation/quality/duration/timing/severity/associated sxs/prior Treatment) HPI   25 year old female without any significant past medical history who presenting with complaint of right flank pain. Patient reports persistent achy cramping pain to her right side of her abdomen which has been ongoing for the past 2 weeks has got progressively worse. She rates pain as 5 out of 10, worsened when she lays on the affected side along with right shoulder discomfort for the same duration. She also endorsed mild constipation but able to have bowel movement and last bowel movement was yesterday. She denies any associated fever, chills, lightheadedness, dizziness, chest pain, shortness of breath, productive cough, back pain, dysuria, hematuria, hematochezia or melena. She denies any overlying skin rash, denies any recent injury or strenuous activity. No prior history of kidney stone. Last menstrual period was at the end of March. She also denies having any specific treatment tried. No other complaint at this time.  History reviewed. No pertinent past medical history. History reviewed. No pertinent past surgical history. No family history on file. Social History  Substance Use Topics  . Smoking status: Former Games developer  . Smokeless tobacco: None  . Alcohol Use: Yes     Comment: social   OB History    No data available     Review of Systems  All other systems reviewed and are negative.     Allergies  Review of patient's allergies indicates no known allergies.  Home Medications   Prior to Admission medications   Medication Sig Start Date End Date Taking? Authorizing Provider  Dextromethorphan-Menthol (DELSYM COUGH+ SOOTHING ACTION) 5-5 MG LOZG Use as directed 1 lozenge in the mouth or throat 3  (three) times daily. Patient not taking: Reported on 09/02/2015 05/18/15   Earley Favor, NP  ibuprofen (ADVIL,MOTRIN) 600 MG tablet Take 1 tablet (600 mg total) by mouth every 6 (six) hours as needed for moderate pain (for pain). Patient not taking: Reported on 09/02/2015 11/10/14   Loren Racer, MD   BP 144/87 mmHg  Pulse 100  Temp(Src) 98.3 F (36.8 C) (Oral)  Resp 16  Ht  (1.753 m)  Wt 102.059 kg  BMI 33.21 kg/m2  SpO2 99%  LMP 11/28/2015 (Approximate) Physical Exam  Constitutional: She is oriented to person, place, and time. She appears well-developed and well-nourished. No distress.  Obese African-American female in no acute discomfort and nontoxic in appearance  HENT:  Head: Atraumatic.  Eyes: Conjunctivae are normal.  Neck: Neck supple.  Cardiovascular:  Mild tachycardia without murmur rubs or gallops  Pulmonary/Chest: Effort normal and breath sounds normal. No respiratory distress. She has no wheezes. She has no rales. She exhibits tenderness (Mild tenderness to right lower anterolateral ribs without crepitus or emphysema noted.).  Abdominal: Soft. Bowel sounds are normal. She exhibits no distension. There is no tenderness.  Abdomen soft nontender, no pain at McBurney's point and negative Murphy's sign.  Genitourinary:  No CVA tenderness  Musculoskeletal:  No tenderness to the palpations of right shoulder. Right shoulder with full range of motion, right arm is nontender with normal grip strength and intact radial pulse.  Neurological: She is alert and oriented to person, place, and time.  Skin: No rash noted.  Psychiatric: She has a normal mood and affect.  Nursing note and vitals  reviewed.   ED Course  Procedures (including critical care time) Labs Review Labs Reviewed  URINALYSIS, ROUTINE W REFLEX MICROSCOPIC (NOT AT [facility]) - Abnormal; Notable for the following:    APPearance CLOUDY (*)    All other components within normal limits  CBC WITH  DIFFERENTIAL/PLATELET - Abnormal; Notable for the following:    RBC 3.64 (*)    Hemoglobin 11.0 (*)    HCT 32.5 (*)    Platelets 424 (*)    All other components within normal limits  I-STAT CHEM 8, ED - Abnormal; Notable for the following:    Hemoglobin 11.9 (*)    HCT 35.0 (*)    All other components within normal limits  POC URINE PREG, ED    Imaging Review Dg Chest 2 View  12/30/2015  CLINICAL DATA:  Right lateral chest pain EXAM: CHEST  2 VIEW COMPARISON:  09/02/2015 FINDINGS: The heart size and mediastinal contours are within normal limits. Both lungs are clear. The visualized skeletal structures are unremarkable. IMPRESSION: No active cardiopulmonary disease. Electronically Signed   By: Alcide CleverMark  Lukens M.D.   On: 12/30/2015 07:07   I have personally reviewed and evaluated these images and lab results as part of my medical decision-making.   EKG Interpretation None      MDM   Final diagnoses:  Right flank pain    BP 144/87 mmHg  Pulse 100  Temp(Src) 98.3 F (36.8 C) (Oral)  Resp 16  Ht 5\' 9"  (1.753 m)  Wt 102.059 kg  BMI 33.21 kg/m2  SpO2 99%  LMP 11/28/2015 (Approximate)   6:53 AM Patient presents with persistent right flank pain and occasional right shoulder pain. Also report mild pleurisy without cough. Pain is not reproducible on exam. She has no CVA tenderness. Will perform a chest x-ray to screen for potential pneumonia, would check UA, pregnancy test, and basic labs to ensure no kidney pathology.  7:57 AM  chest x-ray shows no  Focal infiltrate concerning for pneumonia or any other acute abnormalities on x-ray. Urine without signs of UTI and no blood in the urine to suggest kidney stone. Her pregnancy test is negative. Her renal function is normal. She has no leukocytosis and her electrolytes panels are unremarkable. Skin exam shows no signs of infection. Patient was reassured. Encouraged patient to follow-up with PCP for further evaluation of her condition.  Return precaution discussed.  Fayrene HelperBowie Zakiyah Diop, PA-C 12/30/15 [id number]  Derwood KaplanAnkit Nanavati, MD 12/30/15 [phone]

## 2015-12-30 NOTE — Discharge Instructions (Signed)
Flank Pain °Flank pain is pain in your side. The flank is the area of your side between your upper belly (abdomen) and your back. Pain in this area can be caused by many different things. °HOME CARE °Home care and treatment will depend on the cause of your pain. °· Rest as told by your doctor. °· Drink enough fluids to keep your pee (urine) clear or pale yellow.   °· Only take medicine as told by your doctor. °· Tell your doctor about any changes in your pain. °· Follow up with your doctor. °GET HELP RIGHT AWAY IF:  °· Your pain does not get better with medicine.   °· You have new symptoms or your symptoms get worse. °· Your pain gets worse.   °· You have belly (abdominal) pain.   °· You are short of breath.   °· You always feel sick to your stomach (nauseous).   °· You keep throwing up (vomiting).   °· You have puffiness (swelling) in your belly.   °· You feel light-headed or you pass out (faint).   °· You have blood in your pee. °· You have a fever or lasting symptoms for more than 2-3 days. °· You have a fever and your symptoms suddenly get worse. °MAKE SURE YOU:  °· Understand these instructions. °· Will watch your condition. °· Will get help right away if you are not doing well or get worse. °  °This information is not intended to replace advice given to you by your health care provider. Make sure you discuss any questions you have with your health care provider. °  °Document Released: 05/29/2008 Document Revised: 09/10/2014 Document Reviewed: 04/03/2012 °Elsevier Interactive Patient Education ©2016 [employer] Inc. ° °

## 2016-03-19 ENCOUNTER — Emergency Department (HOSPITAL_COMMUNITY)
Admission: EM | Admit: 2016-03-19 | Discharge: 2016-03-19 | Disposition: A | Discharge status: Home / Self Care | Payer: Managed Care, Other (non HMO) | Attending: Emergency Medicine | Admitting: Emergency Medicine

## 2016-03-19 ENCOUNTER — Encounter (HOSPITAL_COMMUNITY): Payer: Self-pay

## 2016-03-19 DIAGNOSIS — Z87891 Personal history of nicotine dependence: Secondary | ICD-10-CM | POA: Insufficient documentation

## 2016-03-19 DIAGNOSIS — L0889 Other specified local infections of the skin and subcutaneous tissue: Secondary | ICD-10-CM | POA: Diagnosis present

## 2016-03-19 DIAGNOSIS — R2241 Localized swelling, mass and lump, right lower limb: Secondary | ICD-10-CM | POA: Insufficient documentation

## 2016-03-19 DIAGNOSIS — T148XXA Other injury of unspecified body region, initial encounter: Secondary | ICD-10-CM

## 2016-03-19 MED ORDER — BACITRACIN ZINC 500 UNIT/GM EX OINT
TOPICAL_OINTMENT | CUTANEOUS | Status: AC
  Filled 2016-03-19: qty 0.9

## 2016-03-19 MED ORDER — MUPIROCIN CALCIUM 2 % EX CREA: 1.0000 "application " | TOPICAL_CREAM | Freq: Two times a day (BID) | CUTANEOUS | Status: DC

## 2016-03-19 NOTE — Discharge Instructions (Signed)
Wound Care °Taking care of your wound properly can help to prevent pain and infection. It can also help your wound to heal more quickly.  °HOW TO CARE FOR YOUR WOUND  °· Take or apply over-the-counter and prescription medicines only as told by your health care provider. °· If you were prescribed antibiotic medicine, take or apply it as told by your health care provider. Do not stop using the antibiotic even if your condition improves. °· Clean the wound each day or as told by your health care provider. °¨ Wash the wound with mild soap and water. °¨ Rinse the wound with water to remove all soap. °¨ Pat the wound dry with a clean towel. Do not rub it. °· There are many different ways to close and cover a wound. For example, a wound can be covered with stitches (sutures), skin glue, or adhesive strips. Follow instructions from your health care provider about: °¨ How to take care of your wound. °¨ When and how you should change your bandage (dressing). °¨ When you should remove your dressing. °¨ Removing whatever was used to close your wound. °· Check your wound every day for signs of infection. Watch for: °¨ Redness, swelling, or pain. °¨ Fluid, blood, or pus. °· Keep the dressing dry until your health care provider says it can be removed. Do not take baths, swim, use a hot tub, or do anything that would put your wound underwater until your health care provider approves. °· Raise (elevate) the injured area above the level of your heart while you are sitting or lying down. °· Do not scratch or pick at the wound. °· Keep all follow-up visits as told by your health care provider. This is important. °SEEK MEDICAL CARE IF: °· You received a tetanus shot and you have swelling, severe pain, redness, or bleeding at the injection site. °· You have a fever. °· Your pain is not controlled with medicine. °· You have increased redness, swelling, or pain at the site of your wound. °· You have fluid, blood, or pus coming from your  wound. °· You notice a bad smell coming from your wound or your dressing. °SEEK IMMEDIATE MEDICAL CARE IF: °· You have a red streak going away from your wound. °  °This information is not intended to replace advice given to you by your health care provider. Make sure you discuss any questions you have with your health care provider. °  °Document Released: 05/29/2008 Document Revised: 01/04/2015 Document Reviewed: 08/16/2014 °Elsevier Interactive Patient Education ©2016 [employer]. ° °

## 2016-03-19 NOTE — ED Notes (Signed)
Upon entering room pt not present. Pt discharged per provider but left without paperwork thus unable to apply medication and dressing as ordered.

## 2016-03-19 NOTE — ED Provider Notes (Signed)
CSN: [id number]     Arrival date & time 03/19/16  [id number] History   First MD Initiated Contact with Patient 03/19/16 [phone]     Chief Complaint  Patient presents with  . Wound Infection     (Consider location/radiation/quality/duration/timing/severity/associated sxs/prior Treatment) HPI Comments: [age] year old female complains of wound to right lower extremity times several days. States that she has been scratching at it and has noted increasing size of the wound. Denies any surrounding erythema. No wound drainage. No fever. No treatment use prior to arrival nothing makes her symptoms better  The history is provided by the patient.    History reviewed. No pertinent past medical history. History reviewed. No pertinent past surgical history. History reviewed. No pertinent family history. Social History  Substance Use Topics  . Smoking status: Former Games developermoker  . Smokeless tobacco: None  . Alcohol Use: Yes     Comment: social   OB History    No data available     Review of Systems  All other systems reviewed and are negative.     Allergies  Review of patient's allergies indicates no known allergies.  Home Medications   Prior to Admission medications   Medication Sig Start Date End Date Taking? Authorizing Provider  Dextromethorphan-Menthol (DELSYM COUGH+ SOOTHING ACTION) 5-5 MG LOZG Use as directed 1 lozenge in the mouth or throat 3 (three) times daily. Patient not taking: Reported on 09/02/2015 05/18/15   Earley FavorGail Schulz, NP  ibuprofen (ADVIL,MOTRIN) 600 MG tablet Take 1 tablet (600 mg total) by mouth every 6 (six) hours as needed for moderate pain (for pain). Patient not taking: Reported on 09/02/2015 11/10/14   Loren Raceravid Yelverton, MD   BP 142/102 mmHg  Pulse 83  Temp(Src) 98.3 F (36.8 C) (Oral)  Resp 16  SpO2 100%  LMP 02/22/2016 Physical Exam  Constitutional: She is oriented to person, place, and time. She appears well-developed and well-nourished.  Non-toxic appearance.  HENT:    Head: Normocephalic and atraumatic.  Eyes: Conjunctivae are normal. Pupils are equal, round, and reactive to light.  Neck: Normal range of motion.  Cardiovascular: Normal rate.   Pulmonary/Chest: Effort normal.  Musculoskeletal:       Legs: Neurological: She is alert and oriented to person, place, and time.  Skin: Skin is warm and dry.  Psychiatric: She has a normal mood and affect.  Nursing note and vitals reviewed.   ED Course  Procedures (including critical care time) Labs Review Labs Reviewed - No data to display  Imaging Review No results found. I have personally reviewed and evaluated these images and lab results as part of my medical decision-making.   EKG Interpretation None      MDM   Final diagnoses:  None    Wound dressed with antibiotics per nursing and wound care instructions given    Lorre NickAnthony Meiko Ives, MD 03/19/16 [phone]

## 2016-03-19 NOTE — ED Notes (Signed)
Pt states starting last week, noticed red bump to rt leg.  Pt has been scratching site.  Now with redness and drainage

## 2017-08-16 ENCOUNTER — Other Ambulatory Visit: Payer: Self-pay

## 2017-08-16 ENCOUNTER — Emergency Department (HOSPITAL_COMMUNITY): Payer: Self-pay

## 2017-08-16 ENCOUNTER — Encounter (HOSPITAL_COMMUNITY): Payer: Self-pay

## 2017-08-16 ENCOUNTER — Emergency Department (HOSPITAL_COMMUNITY)
Admission: EM | Admit: 2017-08-16 | Discharge: 2017-08-16 | Disposition: A | Discharge status: Home / Self Care | Payer: Self-pay | Attending: Emergency Medicine | Admitting: Emergency Medicine

## 2017-08-16 DIAGNOSIS — Z87891 Personal history of nicotine dependence: Secondary | ICD-10-CM | POA: Insufficient documentation

## 2017-08-16 DIAGNOSIS — N939 Abnormal uterine and vaginal bleeding, unspecified: Secondary | ICD-10-CM

## 2017-08-16 DIAGNOSIS — A599 Trichomoniasis, unspecified: Secondary | ICD-10-CM

## 2017-08-16 LAB — URINALYSIS, ROUTINE W REFLEX MICROSCOPIC
BILIRUBIN URINE: NEGATIVE
Glucose, UA: NEGATIVE mg/dL
KETONES UR: NEGATIVE mg/dL
Nitrite: NEGATIVE
PROTEIN: NEGATIVE mg/dL
Specific Gravity, Urine: 1.008 (ref 1.005–1.030)
pH: 6 (ref 5.0–8.0)

## 2017-08-16 LAB — CBC
HEMATOCRIT: 39.8 % (ref 36.0–46.0)
Hemoglobin: 13.6 g/dL (ref 12.0–15.0)
MCH: 29.6 pg (ref 26.0–34.0)
MCHC: 34.2 g/dL (ref 30.0–36.0)
MCV: 86.7 fL (ref 78.0–100.0)
Platelets: 360 10*3/uL (ref 150–400)
RBC: 4.59 MIL/uL (ref 3.87–5.11)
RDW: 13.9 % (ref 11.5–15.5)
WBC: 9.4 10*3/uL (ref 4.0–10.5)

## 2017-08-16 LAB — COMPREHENSIVE METABOLIC PANEL
ALBUMIN: 4.7 g/dL (ref 3.5–5.0)
ALT: 19 U/L (ref 14–54)
AST: 24 U/L (ref 15–41)
Alkaline Phosphatase: 56 U/L (ref 38–126)
Anion gap: 10 (ref 5–15)
BUN: 8 mg/dL (ref 6–20)
CHLORIDE: 104 mmol/L (ref 101–111)
CO2: 24 mmol/L (ref 22–32)
Calcium: 9.3 mg/dL (ref 8.9–10.3)
Creatinine, Ser: 0.79 mg/dL (ref 0.44–1.00)
GFR calc Af Amer: >60 mL/min (ref >60)
GLUCOSE: 107 mg/dL — AB (ref 65–99)
POTASSIUM: 3.9 mmol/L (ref 3.5–5.1)
Sodium: 138 mmol/L (ref 135–145)
Total Bilirubin: 1 mg/dL (ref 0.3–1.2)
Total Protein: 9.1 g/dL — ABNORMAL HIGH (ref 6.5–8.1)

## 2017-08-16 LAB — WET PREP, GENITAL
Sperm: NONE SEEN
Yeast Wet Prep HPF POC: NONE SEEN

## 2017-08-16 LAB — LIPASE, BLOOD: LIPASE: 32 U/L (ref 11–51)

## 2017-08-16 LAB — I-STAT BETA HCG BLOOD, ED (MC, WL, AP ONLY)

## 2017-08-16 MED ORDER — ONDANSETRON 4 MG PO TBDP
4.0000 mg | ORAL_TABLET | Freq: Once | ORAL | Status: AC
  Administered 2017-08-16: 4 mg via ORAL
  Filled 2017-08-16: qty 1

## 2017-08-16 MED ORDER — CEFTRIAXONE SODIUM 250 MG IJ SOLR
250.0000 mg | Freq: Once | INTRAMUSCULAR | Status: AC
  Administered 2017-08-16: 250 mg via INTRAMUSCULAR
  Filled 2017-08-16: qty 250

## 2017-08-16 MED ORDER — METRONIDAZOLE 500 MG PO TABS
2000.0000 mg | ORAL_TABLET | Freq: Once | ORAL | Status: AC
  Administered 2017-08-16: 2000 mg via ORAL
  Filled 2017-08-16: qty 4

## 2017-08-16 MED ORDER — STERILE WATER FOR INJECTION IJ SOLN
INTRAMUSCULAR | Status: AC
  Administered 2017-08-16: 1.2 mL
  Filled 2017-08-16: qty 10

## 2017-08-16 MED ORDER — AZITHROMYCIN 250 MG PO TABS
1000.0000 mg | ORAL_TABLET | Freq: Once | ORAL | Status: AC
  Administered 2017-08-16: 1000 mg via ORAL
  Filled 2017-08-16: qty 4

## 2017-08-16 NOTE — Discharge Instructions (Signed)
Your trichomonas test was positive. You were treated for this.   Your ultrasound was normal.   Your gonorrhea and chlamydia testing is pending, but you were treated for these infections today. You will be notified via phone call in 2-3 days if test results are positive.   Follow up with OBGYN for continued vaginal spotting.

## 2017-08-16 NOTE — ED Provider Notes (Signed)
[facility]-EMERGENCY DEPT Provider Note   CSN: [id number] Arrival date & time: 08/16/17  1453     History   Chief Complaint Chief Complaint  Patient presents with  . Abdominal Pain  . Vaginal Bleeding  . Hypertension    HPI Janet Velasquez is a 26 y.o. female w no significant pmh presents for evaluation of daily, light, bright vaginal spotting x 2 months. She has to wear one panty liner daily for spotting. Developed suprapubic "cramping" one week ago. Took two pregnancy tests which were positive, two that were negative and another test at pregnancy care center that was also negative. She is sexually active with men only w/ inconsistent condom use. Remote h/o chlamydia. She denies fevers, chills, nausea, vomiting, chest pain, shortness of breath, dysuria, hematuria, abnormal vaginal discharge. No previous history of abnormal or irregular periods. Last normal menstrual period end of October 2018. No previous abdominal surgeries, history of endometriosis, ovarian cysts or fibroids. No aggravating or alleviating factors.  HPI  History reviewed. No pertinent past medical history.  There are no active problems to display for this patient.   History reviewed. No pertinent surgical history.  OB History    No data available       Home Medications    Prior to Admission medications   Medication Sig Start Date End Date Taking? Authorizing Provider  Dextromethorphan-Menthol (DELSYM COUGH+ SOOTHING ACTION) 5-5 MG LOZG Use as directed 1 lozenge in the mouth or throat 3 (three) times daily. Patient not taking: Reported on 09/02/2015 05/18/15   Earley Favor, NP  ibuprofen (ADVIL,MOTRIN) 600 MG tablet Take 1 tablet (600 mg total) by mouth every 6 (six) hours as needed for moderate pain (for pain). Patient not taking: Reported on 09/02/2015 11/10/14   Loren Racer, MD  mupirocin cream (BACTROBAN) 2 % Apply 1 application topically 2 (two) times daily. Patient not  taking: Reported on 08/16/2017 03/19/16   Lorre Nick, MD    Family History No family history on file.  Social History Social History   Tobacco Use  . Smoking status: Former Smoker    Types: Cigarettes  . Smokeless tobacco: Never Used  Substance Use Topics  . Alcohol use: Yes    Comment: social  . Drug use: No     Allergies   Patient has no known allergies.   Review of Systems Review of Systems  Genitourinary: Positive for frequency, menstrual problem, pelvic pain and vaginal bleeding.  All other systems reviewed and are negative.    Physical Exam Updated Vital Signs BP (!) 160/102 (BP Location: Left Arm)   Pulse 94   Temp 98.2 F (36.8 C) (Oral)   Resp 18   Ht 5\' 6"  (1.676 m)   Wt 99.8 kg (220 lb)   LMP 08/16/2017   SpO2 97%   BMI 35.51 kg/m   Physical Exam  Constitutional: She is oriented to person, place, and time. She appears well-developed and well-nourished. No distress.  NAD.  HENT:  Head: Normocephalic and atraumatic.  Right Ear: External ear normal.  Left Ear: External ear normal.  Nose: Nose normal.  Eyes: Conjunctivae and EOM are normal. No scleral icterus.  Neck: Normal range of motion. Neck supple.  Cardiovascular: Normal rate, regular rhythm and normal heart sounds.  No murmur heard. Pulmonary/Chest: Effort normal and breath sounds normal. She has no wheezes.  Abdominal: Soft. Bowel sounds are normal. There is no tenderness.  Obese abdomen. No suprapubic or CVA tenderness. No guarding, rigidity, rebound. Negative  Murphy's and McBurney's.  Genitourinary: There is bleeding in the vagina. Vaginal discharge found.  Genitourinary Comments:  RN as chaperone External genitalia normal without erythema, edema, tenderness, discharge or lesions.  No groin lymphadenopathy.  Vaginal mucosa and cervix normal, pink without lesions +Purulent discharge in top vaginal vault and out of cervix.  Uterus in midline, smooth, not enlarged or tender. No CMT.  Non palpable adnexa.  Musculoskeletal: Normal range of motion. She exhibits no deformity.  Neurological: She is alert and oriented to person, place, and time.  Skin: Skin is warm and dry. Capillary refill takes less than 2 seconds.  Psychiatric: She has a normal mood and affect. Her behavior is normal. Judgment and thought content normal.  Nursing note and vitals reviewed.    ED Treatments / Results  Labs (all labs ordered are listed, but only abnormal results are displayed) Labs Reviewed  WET PREP, GENITAL - Abnormal; Notable for the following components:      Result Value   Trich, Wet Prep PRESENT (*)    Clue Cells Wet Prep HPF POC PRESENT (*)    WBC, Wet Prep HPF POC FEW (*)    All other components within normal limits  COMPREHENSIVE METABOLIC PANEL - Abnormal; Notable for the following components:   Glucose, Bld 107 (*)    Total Protein 9.1 (*)    All other components within normal limits  URINALYSIS, ROUTINE W REFLEX MICROSCOPIC - Abnormal; Notable for the following components:   APPearance HAZY (*)    Hgb urine dipstick LARGE (*)    Leukocytes, UA MODERATE (*)    Bacteria, UA MANY (*)    Squamous Epithelial / LPF 6-30 (*)    All other components within normal limits  LIPASE, BLOOD  CBC  I-STAT BETA HCG BLOOD, ED ([facility], [facility], [facility] ONLY)  GC/CHLAMYDIA PROBE AMP ([place]) NOT AT [facility]    EKG  EKG Interpretation None       Radiology US Transvaginal Non-ob  Result Date: 08/16/2017 CLINICAL DATA:  Vaginal bleeding EXAM: TRANSABDOMINAL AND TRANSVAGINAL ULTRASOUND OF PELVIS DOPPLER ULTRASOUND OF OVARIES TECHNIQUE: Both transabdominal and transvaginal ultrasound examinations of the pelvis were performed. Transabdominal technique was performed for global imaging of the pelvis including uterus, ovaries, adnexal regions, and pelvic cul-de-sac. It was necessary to proceed with endovaginal exam following the transabdominal exam to visualize the ovaries. Color and duplex Doppler  ultrasound was utilized to evaluate blood flow to the ovaries. COMPARISON:  None. FINDINGS: Uterus Measurements: 5.9 x 3.6 x 4.0 cm. No fibroids or other mass visualized. Endometrium Thickness: 11 mm in thickness.  No focal abnormality visualized. Right ovary Measurements: 3.5 x 3.4 x 3.0 cm. Normal appearance/no adnexal mass. Left ovary Measurements: 4.0 x 3.0 x 3.2 cm. Normal appearance/no adnexal mass. Pulsed Doppler evaluation of both ovaries demonstrates normal low-resistance arterial and venous waveforms. Other findings No abnormal free fluid. IMPRESSION: Normal pelvic ultrasound. Electronically Signed   By: Charlett Nose M.D.   On: 08/16/2017 21:52   US Pelvis Complete  Result Date: 08/16/2017 CLINICAL DATA:  Vaginal bleeding EXAM: TRANSABDOMINAL AND TRANSVAGINAL ULTRASOUND OF PELVIS DOPPLER ULTRASOUND OF OVARIES TECHNIQUE: Both transabdominal and transvaginal ultrasound examinations of the pelvis were performed. Transabdominal technique was performed for global imaging of the pelvis including uterus, ovaries, adnexal regions, and pelvic cul-de-sac. It was necessary to proceed with endovaginal exam following the transabdominal exam to visualize the ovaries. Color and duplex Doppler ultrasound was utilized to evaluate blood flow to the ovaries. COMPARISON:  None.  FINDINGS: Uterus Measurements: 5.9 x 3.6 x 4.0 cm. No fibroids or other mass visualized. Endometrium Thickness: 11 mm in thickness.  No focal abnormality visualized. Right ovary Measurements: 3.5 x 3.4 x 3.0 cm. Normal appearance/no adnexal mass. Left ovary Measurements: 4.0 x 3.0 x 3.2 cm. Normal appearance/no adnexal mass. Pulsed Doppler evaluation of both ovaries demonstrates normal low-resistance arterial and venous waveforms. Other findings No abnormal free fluid. IMPRESSION: Normal pelvic ultrasound. Electronically Signed   By: Charlett NoseKevin  Dover M.D.   On: 08/16/2017 21:52   [place] Art/ven Flow Abd Pelv Doppler  Result Date: 08/16/2017 CLINICAL  DATA:  Vaginal bleeding EXAM: TRANSABDOMINAL AND TRANSVAGINAL ULTRASOUND OF PELVIS DOPPLER ULTRASOUND OF OVARIES TECHNIQUE: Both transabdominal and transvaginal ultrasound examinations of the pelvis were performed. Transabdominal technique was performed for global imaging of the pelvis including uterus, ovaries, adnexal regions, and pelvic cul-de-sac. It was necessary to proceed with endovaginal exam following the transabdominal exam to visualize the ovaries. Color and duplex Doppler ultrasound was utilized to evaluate blood flow to the ovaries. COMPARISON:  None. FINDINGS: Uterus Measurements: 5.9 x 3.6 x 4.0 cm. No fibroids or other mass visualized. Endometrium Thickness: 11 mm in thickness.  No focal abnormality visualized. Right ovary Measurements: 3.5 x 3.4 x 3.0 cm. Normal appearance/no adnexal mass. Left ovary Measurements: 4.0 x 3.0 x 3.2 cm. Normal appearance/no adnexal mass. Pulsed Doppler evaluation of both ovaries demonstrates normal low-resistance arterial and venous waveforms. Other findings No abnormal free fluid. IMPRESSION: Normal pelvic ultrasound. Electronically Signed   By: Charlett NoseKevin  Dover M.D.   On: 08/16/2017 21:52    Procedures Procedures (including critical care time)  Medications Ordered in ED Medications  cefTRIAXone (ROCEPHIN) injection 250 mg (250 mg Intramuscular Given 08/16/17 2137)  azithromycin (ZITHROMAX) tablet 1,000 mg (1,000 mg Oral Given 08/16/17 2137)  metroNIDAZOLE (FLAGYL) tablet 2,000 mg (2,000 mg Oral Given 08/16/17 2137)  ondansetron (ZOFRAN-ODT) disintegrating tablet 4 mg (4 mg Oral Given 08/16/17 2138)  sterile water (preservative free) injection (1.2 mLs  Given 08/16/17 2207)     Initial Impression / Assessment and Plan / ED Course  I have reviewed the triage vital signs and the nursing notes.  Pertinent labs & imaging results that were available during my care of the patient were reviewed by me and considered in my medical decision making (see chart for  details).  Clinical Course as of Aug 16 2330  Fri Aug 16, 2017  1846 Appearance: (!) HAZY [CG]  1847 Hgb urine dipstick: (!) LARGE [CG]  1847 Leukocytes, UA: (!) MODERATE [CG]  2328 Ivery Qualerich, Wet Prep: (!) PRESENT [CG]    Clinical Course User Index [CG] Liberty HandyGibbons, Illias Pantano J, PA-C   26 year old female presents with vaginal spotting and intermittent pelvic cramping. Exam is mostly reassuring, abdomen is soft and nontender. Pelvic exam revealed purulent discharge from vaginal vault and cervix and no adnexal or cervical tenderness.  Lab work today reveals Trichomonas. Hemoglobin and leukocytes in urine likely from STD.negative hCG. Ultrasound unremarkable. GC/hlamydia testing pending. She has been treated empirically for STDs including Trichomonas.  Given reassuring exam, benign abdominal exam suspect symptoms may be from Trichomonas. We'll discharge with STD/safe sex education. Advised to follow-up with OB/GYN if vaginal spotting persists despite STD treatment. Discussed return precautions.  Final Clinical Impressions(s) / ED Diagnoses   Final diagnoses:  Vaginal spotting  Trichomoniasis    ED Discharge Orders    None       Liberty HandyGibbons, Tabetha Haraway J, PA-C 08/16/17 2331    Cardama, Amadeo GarnetPedro Eduardo,  MD 08/17/17 [id number]

## 2017-08-16 NOTE — ED Triage Notes (Signed)
Patient reports that she has been having lower abdominal cramping and spotting x 2 months. Patient also states she had 2 positive home pregnancy tests and a negative test at the pregnancy care center.

## 2017-08-16 NOTE — ED Notes (Signed)
Patient ready for pelvic

## 2017-08-19 LAB — GC/CHLAMYDIA PROBE AMP (~~LOC~~) NOT AT ARMC
CHLAMYDIA, DNA PROBE: POSITIVE — AB
Neisseria Gonorrhea: NEGATIVE

## 2017-11-03 ENCOUNTER — Encounter (HOSPITAL_COMMUNITY): Payer: Self-pay | Admitting: Emergency Medicine

## 2017-11-03 ENCOUNTER — Other Ambulatory Visit: Payer: Self-pay

## 2017-11-03 ENCOUNTER — Emergency Department (HOSPITAL_COMMUNITY)
Admission: EM | Admit: 2017-11-03 | Discharge: 2017-11-04 | Disposition: A | Discharge status: Home / Self Care | Payer: Managed Care, Other (non HMO) | Attending: Emergency Medicine | Admitting: Emergency Medicine

## 2017-11-03 ENCOUNTER — Emergency Department (HOSPITAL_COMMUNITY): Payer: Managed Care, Other (non HMO)

## 2017-11-03 DIAGNOSIS — R0789 Other chest pain: Secondary | ICD-10-CM

## 2017-11-03 DIAGNOSIS — Z87891 Personal history of nicotine dependence: Secondary | ICD-10-CM | POA: Insufficient documentation

## 2017-11-03 DIAGNOSIS — R002 Palpitations: Secondary | ICD-10-CM | POA: Insufficient documentation

## 2017-11-03 LAB — BASIC METABOLIC PANEL
ANION GAP: 9 (ref 5–15)
BUN: 6 mg/dL (ref 6–20)
CALCIUM: 8.9 mg/dL (ref 8.9–10.3)
CO2: 28 mmol/L (ref 22–32)
Chloride: 103 mmol/L (ref 101–111)
Creatinine, Ser: 0.87 mg/dL (ref 0.44–1.00)
Glucose, Bld: 109 mg/dL — ABNORMAL HIGH (ref 65–99)
POTASSIUM: 3.8 mmol/L (ref 3.5–5.1)
Sodium: 140 mmol/L (ref 135–145)

## 2017-11-03 LAB — CBC
HEMATOCRIT: 36.8 % (ref 36.0–46.0)
Hemoglobin: 12.1 g/dL (ref 12.0–15.0)
MCH: 29.2 pg (ref 26.0–34.0)
MCHC: 32.9 g/dL (ref 30.0–36.0)
MCV: 88.9 fL (ref 78.0–100.0)
Platelets: 404 10*3/uL — ABNORMAL HIGH (ref 150–400)
RBC: 4.14 MIL/uL (ref 3.87–5.11)
RDW: 13.9 % (ref 11.5–15.5)
WBC: 9.3 10*3/uL (ref 4.0–10.5)

## 2017-11-03 LAB — I-STAT BETA HCG BLOOD, ED (MC, WL, AP ONLY)

## 2017-11-03 LAB — I-STAT TROPONIN, ED: TROPONIN I, POC: 0 ng/mL (ref 0.00–0.08)

## 2017-11-03 MED ORDER — ACETAMINOPHEN 325 MG PO TABS
650.0000 mg | ORAL_TABLET | Freq: Once | ORAL | Status: AC
  Administered 2017-11-03: 650 mg via ORAL
  Filled 2017-11-03: qty 2

## 2017-11-03 MED ORDER — IBUPROFEN 800 MG PO TABS
800.0000 mg | ORAL_TABLET | Freq: Once | ORAL | Status: AC
  Administered 2017-11-03: 800 mg via ORAL
  Filled 2017-11-03: qty 1

## 2017-11-03 MED ORDER — KETOROLAC TROMETHAMINE 30 MG/ML IJ SOLN
30.0000 mg | Freq: Once | INTRAMUSCULAR | Status: DC
  Filled 2017-11-03: qty 1

## 2017-11-03 NOTE — ED Notes (Signed)
No respiratory or acute distress noted alert and oriented x 3 call light in reach. 

## 2017-11-03 NOTE — ED Triage Notes (Signed)
Patient states she is having chest tightness. Patient states she was here yesterday with anxiety.

## 2017-11-03 NOTE — ED Notes (Signed)
No respiratory or acute distress noted pt refused IM shot of Toradol, alert and oriented x 3 call light in reach.

## 2017-11-03 NOTE — ED Provider Notes (Signed)
[facility]-EMERGENCY DEPT Provider Note   CSN: [id number] Arrival date & time: 11/03/17  1919     History   Chief Complaint Chief Complaint  Patient presents with  . Chest Pain    HPI Janet Velasquez is a 27 y.o. female who presents to ED for evaluation of a 3-year history of right-sided chest pain, intermittent palpitations.  Describes the pain as a tightness sensation that will sometimes radiate to her abdomen.  When asked about what made this pain different from her pain for the past 3 years, she states that she drank some alcohol last night and had worsening of her palpitations and chest pain.  She has been seen and evaluated multiple times for similar symptoms.  States that symptoms first began after smoking marijuana 3 years ago.  She was told at her previous visit to follow-up with a cardiologist but she never got the chance to.  She denies any hemoptysis, fever, URI symptoms, cough, vomiting, family history of CAD at a young age, recent prolonged travel, recent surgeries, OCP use, tobacco use, drug use, prior DVT or PE.  HPI  History reviewed. No pertinent past medical history.  There are no active problems to display for this patient.   History reviewed. No pertinent surgical history.  OB History    No data available       Home Medications    Prior to Admission medications   Medication Sig Start Date End Date Taking? Authorizing Provider  naproxen (NAPROSYN) 500 MG tablet Take 1 tablet (500 mg total) by mouth 2 (two) times daily. 11/04/17   Dietrich PatesKhatri, Nalayah Hitt, PA-C    Family History History reviewed. No pertinent family history.  Social History Social History   Tobacco Use  . Smoking status: Former Smoker    Types: Cigarettes  . Smokeless tobacco: Never Used  Substance Use Topics  . Alcohol use: Yes    Comment: social  . Drug use: No     Allergies   Patient has no known allergies.   Review of Systems Review of Systems    Constitutional: Negative for appetite change, chills and fever.  HENT: Negative for ear pain, rhinorrhea, sneezing and sore throat.   Eyes: Negative for photophobia and visual disturbance.  Respiratory: Negative for cough, chest tightness, shortness of breath and wheezing.   Cardiovascular: Positive for chest pain. Negative for palpitations.  Gastrointestinal: Positive for nausea. Negative for abdominal pain, blood in stool, constipation, diarrhea and vomiting.  Genitourinary: Negative for dysuria, hematuria and urgency.  Musculoskeletal: Negative for myalgias.  Skin: Negative for rash.  Neurological: Negative for dizziness, weakness and light-headedness.     Physical Exam Updated Vital Signs BP (!) 152/109 (BP Location: Left Arm)   Pulse 93   Temp 98 F (36.7 C) (Oral)   Resp 18   Ht 5\' 10"  (1.778 m)   Wt 104.3 kg (230 lb)   SpO2 98%   BMI 33.00 kg/m   Physical Exam  Constitutional: She appears well-developed and well-nourished. No distress.  Nontoxic appearing and in no acute distress. Appears anxious.  HENT:  Head: Normocephalic and atraumatic.  Nose: Nose normal.  Eyes: Conjunctivae and EOM are normal. Right eye exhibits no discharge. Left eye exhibits no discharge. No scleral icterus.  Neck: Normal range of motion. Neck supple.  Cardiovascular: Normal rate, regular rhythm, normal heart sounds and intact distal pulses. Exam reveals no gallop and no friction rub.  No murmur heard. Pulmonary/Chest: Effort normal and breath sounds normal. No  respiratory distress. She exhibits tenderness.    Abdominal: Soft. Bowel sounds are normal. She exhibits no distension. There is no tenderness. There is no guarding.  Musculoskeletal: Normal range of motion. She exhibits no edema.  No lower extremity edema, erythema or calf tenderness noted bilaterally.  Neurological: She is alert. She exhibits normal muscle tone. Coordination normal.  Skin: Skin is warm and dry. No rash noted.   Psychiatric: She has a normal mood and affect.  Nursing note and vitals reviewed.    ED Treatments / Results  Labs (all labs ordered are listed, but only abnormal results are displayed) Labs Reviewed  BASIC METABOLIC PANEL - Abnormal; Notable for the following components:      Result Value   Glucose, Bld 109 (*)    All other components within normal limits  CBC - Abnormal; Notable for the following components:   Platelets 404 (*)    All other components within normal limits  I-STAT TROPONIN, ED  I-STAT BETA HCG BLOOD, ED ([facility], [facility], [facility] ONLY)  I-STAT TROPONIN, ED    EKG  EKG Interpretation  Date/Time:  Sunday November 03 2017 19:36:18 EST Ventricular Rate:  93 PR Interval:    QRS Duration: 88 QT Interval:  352 QTC Calculation: 438 R Axis:   61 Text Interpretation:  Sinus rhythm Nonspecific T abnormalities, lateral leads Otherwise no signficant change Confirmed by Shaune Pollack [phone]) on 11/03/2017 10:46:28 PM       Radiology Dg Chest 2 View  Result Date: 11/03/2017 CLINICAL DATA:  Chest tightness EXAM: CHEST  2 VIEW COMPARISON:  Chest radiograph 12/30/2015 FINDINGS: Monitoring leads overlie the patient. Stable cardiac and mediastinal contours. No consolidative pulmonary opacities. No pleural effusion or pneumothorax. IMPRESSION: No acute cardiopulmonary process. Electronically Signed   By: Annia Belt M.D.   On: 11/03/2017 20:04    Procedures Procedures (including critical care time)  Medications Ordered in ED Medications  ibuprofen (ADVIL,MOTRIN) tablet 800 mg (800 mg Oral Given 11/03/17 2215)  acetaminophen (TYLENOL) tablet 650 mg (650 mg Oral Given 11/03/17 2215)     Initial Impression / Assessment and Plan / ED Course  I have reviewed the triage vital signs and the nursing notes.  Pertinent labs & imaging results that were available during my care of the patient were reviewed by me and considered in my medical decision making (see chart for details).  Clinical  Course as of Nov 04 5  Wynelle Link Nov 03, 2017  2217 HR improved to 87 without intervention. Patient resting comfortably.  [HK]    Clinical Course User Index [HK] Dietrich Pates, PA-C    Patient presents to ED for evaluation of 8yr history of chest pain. She has been seen and evaluated in this ED several times for these symptoms with negative workup.  Chest pain is located on the right side.  Denies hemoptysis, shortness of breath, recent surgeries, recent prolonged travel, history of MI, DVT or PE.  She states that it got worse last night after she drank several alcoholic beverages.  On physical exam she is overall well-appearing.  Her chest pain was reproducible with palpation.  She did have a reading of mild tachycardia at low 100s in triage but during my many re-evaluations her heart rate has stayed well below 100 in the mid 80s.  She appears overall well.  She is not tachypneic or hypoxic.  She is afebrile.  Troponin was negative.  CBC, BMP, hCG was negative.  EKG shows nonspecific T wave abnormalities in lateral  leads which have not been present previously.  As result of this, delta troponin will be negative.  Patient reports significant improvement in her pain with Tylenol and ibuprofen given here in the ED. Initially hypertensive to 170s systolic.  Patient denies any diagnosis of hypertension but states that she has been told she has high blood pressure readings especially when being evaluated by provider. With chart review it appears this has been true on previous ED visits.  I am leaning more towards musculoskeletal or anxiety as a cause of her symptoms.  She is PERC and Wells criteria negative.   Delta troponin returned as negative. Suspect that symptoms are MSK in nature. Will give anti-inflammatories, advise to f/u at [facility] center for further evaluation to establish primary care. Patient is agreeable to plan. Continues to be comfortable, VSS. Patient appears stable for discharge at this time.  Strict return precautions given.  Portions of this note were generated with Scientist, clinical (histocompatibility and immunogenetics). Dictation errors may occur despite best attempts at proofreading.   Final Clinical Impressions(s) / ED Diagnoses   Final diagnoses:  Chest wall pain    ED Discharge Orders        Ordered    naproxen (NAPROSYN) 500 MG tablet  2 times daily     11/04/17 0002       Dietrich Pates, PA-C 11/04/17 0027    Shaune Pollack, MD 11/04/17 [phone]

## 2017-11-03 NOTE — Discharge Instructions (Addendum)
°  Please read attached information regarding your condition. Take naproxen as needed for pain. Follow up with the [facility] for further evaluation and to establish primary care. Return to ED for worsening pain, trouble breathing, coughing up blood, vomiting up blood.

## 2017-11-04 LAB — I-STAT TROPONIN, ED: TROPONIN I, POC: 0 ng/mL (ref 0.00–0.08)

## 2017-11-04 MED ORDER — NAPROXEN 500 MG PO TABS: 500.0000 mg | ORAL_TABLET | Freq: Two times a day (BID) | ORAL | 0 refills | Status: AC

## 2017-11-04 NOTE — ED Notes (Signed)
Tried to stick pt for blood states it hurt too bad removed butterfly needle and informed provider.

## 2017-11-04 NOTE — ED Notes (Signed)
No respiratory or acute distress noted call light in reach alert and oriented x 3 pain 4/10.

## 2018-01-01 ENCOUNTER — Emergency Department (HOSPITAL_COMMUNITY)
Admission: EM | Admit: 2018-01-01 | Discharge: 2018-01-01 | Disposition: A | Discharge status: Home / Self Care | Payer: Managed Care, Other (non HMO) | Attending: Emergency Medicine | Admitting: Emergency Medicine

## 2018-01-01 ENCOUNTER — Encounter (HOSPITAL_COMMUNITY): Payer: Self-pay | Admitting: *Deleted

## 2018-01-01 DIAGNOSIS — Z87891 Personal history of nicotine dependence: Secondary | ICD-10-CM | POA: Insufficient documentation

## 2018-01-01 DIAGNOSIS — R0789 Other chest pain: Secondary | ICD-10-CM | POA: Insufficient documentation

## 2018-01-01 DIAGNOSIS — Z7982 Long term (current) use of aspirin: Secondary | ICD-10-CM | POA: Insufficient documentation

## 2018-01-01 DIAGNOSIS — M25511 Pain in right shoulder: Secondary | ICD-10-CM

## 2018-01-01 NOTE — ED Triage Notes (Signed)
Pt complains of right shoulder and right chest pain. Pt was seen for same last month but states she is not getting any better.

## 2018-01-01 NOTE — ED Provider Notes (Signed)
[facility]-EMERGENCY DEPT Provider Note   CSN: [id number] Arrival date & time: 01/01/18  6962     History   Chief Complaint Chief Complaint  Patient presents with  . Shoulder Pain    right    HPI Janet Velasquez is a 27 y.o. female.  HPI Patient is a 27 year old female who felt some throbbing right shoulder and right anterior chest pain this morning while she was trying to sleep.  She has a long history of recurrent chest pain.  She states that she felt like maybe her heart was beating fast and that was the cause of her right shoulder pain.  No injury or trauma.  Symptoms were mild in severity.  Feels much better at this time.  No weakness of her arms or legs.  No chest pain at this time.  No shortness of breath.  No fevers or chills.  No recent cough.  Currently without a primary care physician.     History reviewed. No pertinent past medical history.  There are no active problems to display for this patient.   History reviewed. No pertinent surgical history.   OB History   None      Home Medications    Prior to Admission medications   Medication Sig Start Date End Date Taking? Authorizing Provider  aspirin 81 MG tablet Take 81 mg by mouth daily.   Yes [provider]  naproxen (NAPROSYN) 500 MG tablet Take 1 tablet (500 mg total) by mouth 2 (two) times daily. Patient not taking: Reported on 01/01/2018 11/04/17   Dietrich Pates, PA-C    Family History No family history on file.  Social History Social History   Tobacco Use  . Smoking status: Former Smoker    Types: Cigarettes  . Smokeless tobacco: Never Used  Substance Use Topics  . Alcohol use: Yes    Comment: social  . Drug use: No    Types: Marijuana     Allergies   Bee venom   Review of Systems Review of Systems  All other systems reviewed and are negative.    Physical Exam Updated Vital Signs BP (!) 137/101 (BP Location: Left Arm)   Pulse 78   Temp 98 F (36.7  C) (Oral)   Resp 19   SpO2 97%   Physical Exam  Constitutional: She is oriented to person, place, and time. She appears well-developed and well-nourished. No distress.  HENT:  Head: Normocephalic and atraumatic.  Eyes: EOM are normal.  Neck: Normal range of motion.  Cardiovascular: Normal rate, regular rhythm and normal heart sounds.  Pulmonary/Chest: Effort normal and breath sounds normal.  Abdominal: Soft. She exhibits no distension. There is no tenderness.  Musculoskeletal: Normal range of motion.  Normal right radial pulse.  Full range of motion of right shoulder.  No swelling of the right upper extremity as compared to the left.  Neurological: She is alert and oriented to person, place, and time.  Skin: Skin is warm and dry.  Psychiatric: She has a normal mood and affect. Judgment normal.  Nursing note and vitals reviewed.    ED Treatments / Results  Labs (all labs ordered are listed, but only abnormal results are displayed) Labs Reviewed - No data to display  EKG EKG Interpretation  Date/Time:  Wednesday Jan 01 2018 12:45:18 EDT Ventricular Rate:  80 PR Interval:    QRS Duration: 88 QT Interval:  383 QTC Calculation: 442 R Axis:   69 Text Interpretation:  Sinus rhythm  Borderline repolarization abnormality No significant change was found Confirmed by Azalia Bilis ([id number]) on 01/01/2018 12:54:19 PM   Radiology No results found.  Procedures Procedures (including critical care time)  Medications Ordered in ED Medications - No data to display   Initial Impression / Assessment and Plan / ED Course  I have reviewed the triage vital signs and the nursing notes.  Pertinent labs & imaging results that were available during my care of the patient were reviewed by me and considered in my medical decision making (see chart for details).     EKG is normal sinus rhythm.  No ischemic changes.  No chest pain at this time.  Full range of motion of right shoulder.  No  indication for imaging.  Patient be discharged home to follow-up with a primary care physician.  She understands return to the ER for new or worsening symptoms.  Nonspecific right shoulder pain.  Final Clinical Impressions(s) / ED Diagnoses   Final diagnoses:  None    ED Discharge Orders    None       Azalia Bilis, MD 01/01/18 1302

## 2018-02-03 ENCOUNTER — Other Ambulatory Visit: Payer: Self-pay

## 2018-02-03 ENCOUNTER — Encounter (HOSPITAL_COMMUNITY): Payer: Self-pay

## 2018-02-03 ENCOUNTER — Emergency Department (HOSPITAL_COMMUNITY)
Admission: EM | Admit: 2018-02-03 | Discharge: 2018-02-03 | Disposition: A | Discharge status: Home / Self Care | Payer: Self-pay | Attending: Emergency Medicine | Admitting: Emergency Medicine

## 2018-02-03 ENCOUNTER — Emergency Department (HOSPITAL_COMMUNITY): Payer: Self-pay

## 2018-02-03 DIAGNOSIS — Z87891 Personal history of nicotine dependence: Secondary | ICD-10-CM | POA: Insufficient documentation

## 2018-02-03 DIAGNOSIS — Z7982 Long term (current) use of aspirin: Secondary | ICD-10-CM | POA: Insufficient documentation

## 2018-02-03 DIAGNOSIS — R002 Palpitations: Secondary | ICD-10-CM | POA: Insufficient documentation

## 2018-02-03 DIAGNOSIS — R0602 Shortness of breath: Secondary | ICD-10-CM | POA: Insufficient documentation

## 2018-02-03 LAB — CBC
HEMATOCRIT: 39.8 % (ref 36.0–46.0)
Hemoglobin: 13 g/dL (ref 12.0–15.0)
MCH: 28.4 pg (ref 26.0–34.0)
MCHC: 32.7 g/dL (ref 30.0–36.0)
MCV: 86.9 fL (ref 78.0–100.0)
PLATELETS: 405 10*3/uL — AB (ref 150–400)
RBC: 4.58 MIL/uL (ref 3.87–5.11)
RDW: 13.2 % (ref 11.5–15.5)
WBC: 7.5 10*3/uL (ref 4.0–10.5)

## 2018-02-03 LAB — BASIC METABOLIC PANEL
Anion gap: 9 (ref 5–15)
BUN: 8 mg/dL (ref 6–20)
CO2: 27 mmol/L (ref 22–32)
CREATININE: 0.95 mg/dL (ref 0.44–1.00)
Calcium: 9.3 mg/dL (ref 8.9–10.3)
Chloride: 106 mmol/L (ref 101–111)
GFR calc Af Amer: >60 mL/min (ref >60)
Glucose, Bld: 115 mg/dL — ABNORMAL HIGH (ref 65–99)
POTASSIUM: 3.6 mmol/L (ref 3.5–5.1)
SODIUM: 142 mmol/L (ref 135–145)

## 2018-02-03 LAB — I-STAT BETA HCG BLOOD, ED (MC, WL, AP ONLY): I-stat hCG, quantitative: <5 m[IU]/mL (ref <5)

## 2018-02-03 NOTE — ED Triage Notes (Signed)
Pt presents for evaluation of palpitations only at night x 1 month. Pt reports never feels discomfort during day and worse when lying on R side.

## 2018-02-03 NOTE — ED Provider Notes (Signed)
[facility] EMERGENCY DEPARTMENT Provider Note   CSN: [id number] Arrival date & time: 02/03/18  1216   History   Chief Complaint Chief Complaint  Patient presents with  . Palpitations    HPI Janet Velasquez is a 27 y.o. female.  HPI   27 year old female presents today with complaints of palpitations.  Patient notes several month history of palpitations.  She notes that at night when she lays down she gets the sensation of her heart beating in her chest.  She notes some discomfort along the right and mid chest.  Patient notes she is slightly short of breath with this.  She denies any other associated symptoms.  Patient notes that during the day she does not have symptoms.  Patient notes some shortness of breath with ambulation over the last year, she denies any at rest, denies any lower extremity swelling edema or chest pain presently.  No history DVT in the past, no significant risk factors.  Patient is a non-smoker.   History reviewed. No pertinent past medical history.  There are no active problems to display for this patient.   History reviewed. No pertinent surgical history.   OB History   None      Home Medications    Prior to Admission medications   Medication Sig Start Date End Date Taking? Authorizing Provider  aspirin 81 MG tablet Take 81 mg by mouth daily.    [provider]  naproxen (NAPROSYN) 500 MG tablet Take 1 tablet (500 mg total) by mouth 2 (two) times daily. Patient not taking: Reported on 01/01/2018 11/04/17   Dietrich Pates, PA-C    Family History No family history on file.  Social History Social History   Tobacco Use  . Smoking status: Former Smoker    Types: Cigarettes  . Smokeless tobacco: Never Used  Substance Use Topics  . Alcohol use: Yes    Comment: social  . Drug use: No    Types: Marijuana     Allergies   Bee venom   Review of Systems Review of Systems  All other systems reviewed and are  negative.  Physical Exam Updated Vital Signs BP 140/87   Pulse 87   Temp 98.3 F (36.8 C) (Oral)   Resp 18   LMP 01/27/2018 (Approximate)   SpO2 99%   Physical Exam  Constitutional: She is oriented to person, place, and time. She appears well-developed and well-nourished.  HENT:  Head: Normocephalic and atraumatic.  Eyes: Pupils are equal, round, and reactive to light. Conjunctivae are normal. Right eye exhibits no discharge. Left eye exhibits no discharge. No scleral icterus.  Neck: Normal range of motion. No JVD present. No tracheal deviation present.  Cardiovascular: Normal rate, regular rhythm, normal heart sounds and intact distal pulses. Exam reveals no gallop and no friction rub.  No murmur heard. Pulmonary/Chest: Effort normal and breath sounds normal. No stridor. No respiratory distress. She has no wheezes. She has no rales. She exhibits no tenderness.  Musculoskeletal:  No lower extremity edema  Neurological: She is alert and oriented to person, place, and time. Coordination normal.  Psychiatric: She has a normal mood and affect. Her behavior is normal. Judgment and thought content normal.  Nursing note and vitals reviewed.    ED Treatments / Results  Labs (all labs ordered are listed, but only abnormal results are displayed) Labs Reviewed  BASIC METABOLIC PANEL - Abnormal; Notable for the following components:      Result Value   Glucose,  Bld 115 (*)    All other components within normal limits  CBC - Abnormal; Notable for the following components:   Platelets 405 (*)    All other components within normal limits  I-STAT BETA HCG BLOOD, ED ([facility], [facility], [facility] ONLY)    EKG EKG Interpretation  Date/Time:  Monday February 03 2018 12:23:52 EDT Ventricular Rate:  104 PR Interval:  144 QRS Duration: 78 QT Interval:  334 QTC Calculation: 439 R Axis:   54 Text Interpretation:  Sinus tachycardia Otherwise normal ECG Confirmed by Benjiman CorePickering, Nathan [phone]([id number]) on 02/03/2018 4:25:32  PM   Radiology Dg Chest 2 View  Result Date: 02/03/2018 CLINICAL DATA:  Palpitations, shortness of breath, cough EXAM: CHEST - 2 VIEW COMPARISON:  11/03/2017 FINDINGS: Heart and mediastinal contours are within normal limits. No focal opacities or effusions. No acute bony abnormality. IMPRESSION: No active cardiopulmonary disease. Electronically Signed   By: Charlett NoseKevin  Dover M.D.   On: 02/03/2018 13:15    Procedures Procedures (including critical care time)  Medications Ordered in ED Medications - No data to display   Initial Impression / Assessment and Plan / ED Course  I have reviewed the triage vital signs and the nursing notes.  Pertinent labs & imaging results that were available during my care of the patient were reviewed by me and considered in my medical decision making (see chart for details).      Final Clinical Impressions(s) / ED Diagnoses   Final diagnoses:  Palpitations   Labs: I-STAT beta-hCG, BMP, CBC  Imaging: DG chest 2 view  Consults:  Therapeutics:  Discharge Meds:   Assessment/Plan: 27 year old female presents today with complaints of palpitations.  Patient is very well-appearing in no acute distress.  This has been going on for several months, I have very low suspicion for ACS dissection PE, abnormal cardiac rhythm.  Likely palpitations secondary to anxiety.  Patient is PERC negative with a low Wells score.  She will be discharged with outpatient follow-up, strict return precautions.  She verbalized understanding and agreement to today's plan had no further questions or concerns    ED Discharge Orders    None       Rosalio LoudHedges, Kenidi Elenbaas, PA-C 02/03/18 Andria [id number]    Pickering, Nathan, MD 02/03/18 2129

## 2018-02-03 NOTE — Discharge Instructions (Addendum)
Please read attached information. If you experience any new or worsening signs or symptoms please return to the emergency room for evaluation. Please follow-up with your primary care provider or specialist as discussed.  °

## 2018-05-25 ENCOUNTER — Other Ambulatory Visit: Payer: Self-pay

## 2018-05-25 ENCOUNTER — Emergency Department (HOSPITAL_COMMUNITY)
Admission: EM | Admit: 2018-05-25 | Discharge: 2018-05-25 | Disposition: A | Discharge status: Home / Self Care | Payer: Self-pay | Attending: Emergency Medicine | Admitting: Emergency Medicine

## 2018-05-25 DIAGNOSIS — Z79899 Other long term (current) drug therapy: Secondary | ICD-10-CM | POA: Insufficient documentation

## 2018-05-25 DIAGNOSIS — Z87891 Personal history of nicotine dependence: Secondary | ICD-10-CM | POA: Insufficient documentation

## 2018-05-25 DIAGNOSIS — I10 Essential (primary) hypertension: Secondary | ICD-10-CM | POA: Insufficient documentation

## 2018-05-25 MED ORDER — HYDROCHLOROTHIAZIDE 12.5 MG PO TABS: 12.5000 mg | ORAL_TABLET | Freq: Every day | ORAL | 1 refills | Status: DC

## 2018-05-25 NOTE — ED Triage Notes (Signed)
Patient c/o high blood pressure and is requesting that we right a RX until her insurance begins in 90 days.

## 2018-05-25 NOTE — ED Provider Notes (Signed)
[facility] Provider Note   CSN: [id number] Arrival date & time: 05/25/18  0040     History   Chief Complaint Chief Complaint  Patient presents with  . Hypertension    HPI Janet Velasquez is a 27 y.o. female with a hx of no major medical problems presents to the Emergency Department complaining of high blood pressure which she noticed tonight.  Patient reports she was at work and her blood pressure was 157/93.  Unknown onset, but pt reports that last time she was in the ED her BP was high.  She reports she does not currently carry a diagnosis of this.  She reports no known aggravating or alleviating factors for her HTN.  Patient denies headache, neck pain, chest pain, shortness of breath, abdominal pain, nausea, vomiting, diarrhea, leg swelling, polyuria, polydipsia, joint pain or swelling, confusion, lightheadedness, slurred speech, weakness, syncope, dysuria.  Patient is requesting medication for her blood pressure.  She does not have a primary care physician.  The history is provided by the patient and medical records. No language interpreter was used.    No past medical history on file.  There are no active problems to display for this patient.   No past surgical history on file.   OB History   None      Home Medications    Prior to Admission medications   Medication Sig Start Date End Date Taking? Authorizing Provider  aspirin 81 MG tablet Take 81 mg by mouth daily.    [provider]  hydrochlorothiazide (HYDRODIURIL) 12.5 MG tablet Take 1 tablet (12.5 mg total) by mouth daily. 05/25/18   Maisy Newport, Dahlia ClientHannah, PA-C  naproxen (NAPROSYN) 500 MG tablet Take 1 tablet (500 mg total) by mouth 2 (two) times daily. Patient not taking: Reported on 01/01/2018 11/04/17   Dietrich PatesKhatri, Hina, PA-C    Family History No family history on file.  Social History Social History   Tobacco Use  . Smoking status: Former Smoker    Types:  Cigarettes  . Smokeless tobacco: Never Used  Substance Use Topics  . Alcohol use: Yes    Comment: social  . Drug use: No    Types: Marijuana     Allergies   Bee venom   Review of Systems Review of Systems  Constitutional: Negative for appetite change, diaphoresis, fatigue, fever and unexpected weight change.  HENT: Negative for mouth sores.   Eyes: Negative for visual disturbance.  Respiratory: Negative for cough, chest tightness, shortness of breath and wheezing.   Cardiovascular: Negative for chest pain.  Gastrointestinal: Negative for abdominal pain, constipation, diarrhea, nausea and vomiting.  Endocrine: Negative for polydipsia, polyphagia and polyuria.  Genitourinary: Negative for dysuria, frequency, hematuria and urgency.  Musculoskeletal: Negative for back pain and neck stiffness.  Skin: Negative for rash.  Allergic/Immunologic: Negative for immunocompromised state.  Neurological: Negative for syncope, light-headedness and headaches.  Hematological: Does not bruise/bleed easily.  Psychiatric/Behavioral: Negative for sleep disturbance. The patient is not nervous/anxious.      Physical Exam Updated Vital Signs BP (!) 164/110 (BP Location: Right Arm)   Pulse 84   Temp 97.9 F (36.6 C)   Resp 18   Ht 5\' 10"  (1.778 m)   Wt 127.5 kg   SpO2 100%   BMI 40.32 kg/m   Physical Exam  Constitutional: She appears well-developed and well-nourished. No distress.  Awake, alert, nontoxic appearance  HENT:  Head: Normocephalic and atraumatic.  Eyes: Conjunctivae are normal. No scleral icterus.  Neck: Normal range of motion. Neck supple.  Cardiovascular: Normal rate, regular rhythm and intact distal pulses.  Pulmonary/Chest: Effort normal and breath sounds normal. No respiratory distress. She has no wheezes.  Equal chest expansion  Abdominal: Soft. She exhibits no distension. There is no tenderness.  Musculoskeletal: Normal range of motion. She exhibits no edema.    Neurological: She is alert.  Speech is clear and goal oriented Moves extremities without ataxia  Skin: Skin is warm and dry. She is not diaphoretic.  Psychiatric: She has a normal mood and affect.  Nursing note and vitals reviewed.    ED Treatments / Results   Procedures Procedures (including critical care time)  Medications Ordered in ED Medications - No data to display   Initial Impression / Assessment and Plan / ED Course  I have reviewed the triage vital signs and the nursing notes.  Pertinent labs & imaging results that were available during my care of the patient were reviewed by me and considered in my medical decision making (see chart for details).       Patient presents to the emergency department with complaints of hypertension.  She is hypertensive in the emergency department 164/110.  Record review shows that every time patient evaluated in the emergency department in the last 9 months she has had hypertension with a diastolic greater than 100.  She denies symptoms of hypertensive urgency including headache, vision changes, chest pain, shortness of breath, leg swelling.  She has no history of diabetes and no polydipsia or polyuria today.  Discussed with patient that we will give short prescription for HCTZ.  She is to establish primary care with the [facility] for further evaluation and treatment.  Patient states understanding and is in agreement with the plan.  Final Clinical Impressions(s) / ED Diagnoses   Final diagnoses:  Hypertension, unspecified type    ED Discharge Orders         Ordered    hydrochlorothiazide (HYDRODIURIL) 12.5 MG tablet  Daily     05/25/18 0243           Felipa Laroche, Dahlia Client, PA-C 05/25/18 0504    Gilda Crease, MD 05/25/18 [phone]

## 2018-05-25 NOTE — Discharge Instructions (Signed)
1. Medications: HCTZ, usual home medications 2. Treatment: rest, drink plenty of fluids,  3. Follow Up: Please followup with your primary doctor in 3-5 days for discussion of your diagnoses and further evaluation after today's visit; if you do not have a primary care doctor use the resource guide provided to find one; Please return to the ER for headaches, vision changes, chest pain, shortness of breath or other concerns.

## 2018-06-04 ENCOUNTER — Other Ambulatory Visit: Payer: Self-pay

## 2018-06-04 ENCOUNTER — Emergency Department (HOSPITAL_COMMUNITY): Payer: Self-pay

## 2018-06-04 ENCOUNTER — Encounter (HOSPITAL_COMMUNITY): Payer: Self-pay | Admitting: Emergency Medicine

## 2018-06-04 ENCOUNTER — Emergency Department (HOSPITAL_COMMUNITY)
Admission: EM | Admit: 2018-06-04 | Discharge: 2018-06-04 | Disposition: A | Discharge status: Home / Self Care | Payer: Self-pay | Attending: Emergency Medicine | Admitting: Emergency Medicine

## 2018-06-04 DIAGNOSIS — Z7982 Long term (current) use of aspirin: Secondary | ICD-10-CM | POA: Insufficient documentation

## 2018-06-04 DIAGNOSIS — R0602 Shortness of breath: Secondary | ICD-10-CM | POA: Insufficient documentation

## 2018-06-04 DIAGNOSIS — R05 Cough: Secondary | ICD-10-CM | POA: Insufficient documentation

## 2018-06-04 DIAGNOSIS — Z87891 Personal history of nicotine dependence: Secondary | ICD-10-CM | POA: Insufficient documentation

## 2018-06-04 DIAGNOSIS — I10 Essential (primary) hypertension: Secondary | ICD-10-CM | POA: Insufficient documentation

## 2018-06-04 HISTORY — DX: Essential (primary) hypertension: I10

## 2018-06-04 NOTE — ED Provider Notes (Signed)
[facility]-EMERGENCY DEPT Provider Note   CSN: [id number] Arrival date & time: 06/04/18  0142     History   Chief Complaint Chief Complaint  Patient presents with  . Shortness of Breath    HPI Janet Velasquez is a 27 y.o. female.  HPI  This is a 27 year old female with a history of hypertension who presents with shortness of breath.  Patient reports onset of shortness of breath tonight.  She reports an associated nonproductive cough.  She feels that this may be related to possible mold in her apartment.  No history of asthma or smoking.  Denies any fevers.  No known sick contacts.  Denies chest pain.  Denies leg swelling or history of blood clots.  Currently she is asymptomatic.  She is notably hypertensive in triage.  She has not picked up her blood pressure medication which was prescribed to her 1 week ago.  Past Medical History:  Diagnosis Date  . Hypertension     There are no active problems to display for this patient.   History reviewed. No pertinent surgical history.   OB History   None      Home Medications    Prior to Admission medications   Medication Sig Start Date End Date Taking? Authorizing Provider  aspirin 81 MG tablet Take 81 mg by mouth daily.    [provider]  hydrochlorothiazide (HYDRODIURIL) 12.5 MG tablet Take 1 tablet (12.5 mg total) by mouth daily. 05/25/18   Muthersbaugh, Dahlia Client, PA-C  naproxen (NAPROSYN) 500 MG tablet Take 1 tablet (500 mg total) by mouth 2 (two) times daily. Patient not taking: Reported on 01/01/2018 11/04/17   Dietrich Pates, PA-C    Family History No family history on file.  Social History Social History   Tobacco Use  . Smoking status: Former Smoker    Types: Cigarettes  . Smokeless tobacco: Never Used  Substance Use Topics  . Alcohol use: Yes    Comment: social  . Drug use: No    Types: Marijuana     Allergies   Bee venom   Review of Systems Review of Systems    Constitutional: Negative for fever.  Respiratory: Positive for cough and shortness of breath. Negative for chest tightness and wheezing.   Cardiovascular: Negative for chest pain.  Gastrointestinal: Negative for abdominal pain, nausea and vomiting.  All other systems reviewed and are negative.    Physical Exam Updated Vital Signs BP (!) 162/117 (BP Location: Right Arm)   Pulse 78   Temp (!) 97.5 F (36.4 C) (Oral)   Resp 18   SpO2 98%   Physical Exam  Constitutional: She is oriented to person, place, and time. She appears well-developed and well-nourished.  Obese, nontoxic-appearing, no respiratory distress  HENT:  Head: Normocephalic and atraumatic.  Neck: Neck supple.  Cardiovascular: Normal rate, regular rhythm and normal heart sounds.  Pulmonary/Chest: Effort normal. No respiratory distress. She has no wheezes.  Abdominal: Soft. Bowel sounds are normal.  Musculoskeletal:       Right lower leg: She exhibits no tenderness and no edema.       Left lower leg: She exhibits no tenderness and no edema.  Neurological: She is alert and oriented to person, place, and time.  Skin: Skin is warm and dry.  Psychiatric: She has a normal mood and affect.  Nursing note and vitals reviewed.    ED Treatments / Results  Labs (all labs ordered are listed, but only abnormal results are displayed)  Labs Reviewed - No data to display  EKG EKG Interpretation  Date/Time:  Wednesday June 04 2018 02:20:18 EDT Ventricular Rate:  83 PR Interval:    QRS Duration: 89 QT Interval:  371 QTC Calculation: 436 R Axis:   55 Text Interpretation:  Sinus rhythm Nonspecific T abnormalities, lateral leads Confirmed by Ross Marcus ([id number]) on 06/04/2018 2:45:55 AM   Radiology Dg Chest 2 View  Result Date: 06/04/2018 CLINICAL DATA:  Shortness of breath, hypertension EXAM: CHEST - 2 VIEW COMPARISON:  02/03/2018 FINDINGS: Lungs are clear.  No pleural effusion or pneumothorax. The heart is normal  in size. Visualized osseous structures are within normal limits. IMPRESSION: Normal chest radiographs. Electronically Signed   By: Charline Bills M.D.   On: 06/04/2018 02:45    Procedures Procedures (including critical care time)  Medications Ordered in ED Medications - No data to display   Initial Impression / Assessment and Plan / ED Course  I have reviewed the triage vital signs and the nursing notes.  Pertinent labs & imaging results that were available during my care of the patient were reviewed by me and considered in my medical decision making (see chart for details).     Patient presents with shortness of breath and cough.  She is overall nontoxic and vital signs are reassuring.  She is satting 98% on room air.  No respiratory distress.  Chest x-ray shows no evidence of pneumothorax or pneumonia.  EKG shows no evidence of acute ischemia.  She is hypertensive but has not started her blood pressure medications.  She is currently asymptomatic.  Doubt ACS or PE.  She is PERC neg. I have discussed with patient that she needs to get her blood pressure medication filled.  If she concerned about mold in her apartment she should contact her landlord.  Follow-up with [facility].  After history, exam, and medical workup I feel the patient has been appropriately medically screened and is safe for discharge home. Pertinent diagnoses were discussed with the patient. Patient was given return precautions.   Final Clinical Impressions(s) / ED Diagnoses   Final diagnoses:  SOB (shortness of breath)  Essential hypertension    ED Discharge Orders    None       Deija Buhrman, Mayer Masker, MD 06/04/18 0330

## 2018-06-04 NOTE — ED Notes (Signed)
EKG given to EDP, Horton,MD. For review. 

## 2018-06-04 NOTE — ED Triage Notes (Addendum)
Pt from home with c/o sob. Pt has htn at time of assessment. Pt was given a rx for medication but has not filled it yet. Pt has clear lung sounds. Pt able to speak in complete sentences and is able to maintain normal oxygen saturation. Pt reports black mold at home productive cough.

## 2018-09-19 ENCOUNTER — Other Ambulatory Visit: Payer: Self-pay

## 2018-09-19 ENCOUNTER — Emergency Department (HOSPITAL_BASED_OUTPATIENT_CLINIC_OR_DEPARTMENT_OTHER): Payer: Private Health Insurance - Indemnity

## 2018-09-19 ENCOUNTER — Emergency Department (HOSPITAL_BASED_OUTPATIENT_CLINIC_OR_DEPARTMENT_OTHER)
Admission: EM | Admit: 2018-09-19 | Discharge: 2018-09-19 | Disposition: A | Discharge status: Home / Self Care | Payer: Private Health Insurance - Indemnity | Attending: Emergency Medicine | Admitting: Emergency Medicine

## 2018-09-19 ENCOUNTER — Encounter (HOSPITAL_BASED_OUTPATIENT_CLINIC_OR_DEPARTMENT_OTHER): Payer: Self-pay

## 2018-09-19 DIAGNOSIS — R202 Paresthesia of skin: Secondary | ICD-10-CM | POA: Insufficient documentation

## 2018-09-19 DIAGNOSIS — Z7982 Long term (current) use of aspirin: Secondary | ICD-10-CM | POA: Diagnosis not present

## 2018-09-19 DIAGNOSIS — R238 Other skin changes: Secondary | ICD-10-CM | POA: Diagnosis present

## 2018-09-19 DIAGNOSIS — I10 Essential (primary) hypertension: Secondary | ICD-10-CM | POA: Insufficient documentation

## 2018-09-19 DIAGNOSIS — R21 Rash and other nonspecific skin eruption: Secondary | ICD-10-CM | POA: Insufficient documentation

## 2018-09-19 DIAGNOSIS — L819 Disorder of pigmentation, unspecified: Secondary | ICD-10-CM

## 2018-09-19 DIAGNOSIS — Z79899 Other long term (current) drug therapy: Secondary | ICD-10-CM | POA: Diagnosis not present

## 2018-09-19 MED ORDER — HYDROCHLOROTHIAZIDE 25 MG PO TABS
12.5000 mg | ORAL_TABLET | Freq: Once | ORAL | Status: AC
  Administered 2018-09-19: 12.5 mg via ORAL
  Filled 2018-09-19: qty 1

## 2018-09-19 MED ORDER — HYDROCHLOROTHIAZIDE 12.5 MG PO TABS: 12.5000 mg | ORAL_TABLET | Freq: Every day | ORAL | 1 refills | Status: AC

## 2018-09-19 NOTE — Discharge Instructions (Addendum)
Resume taking your blood pressure medication.  Use ice 3-4 times daily alternating 20 minutes on, 20 minutes off.  Please follow-up and establish care with a primary care provider by calling the number circled on your discharge paperwork.

## 2018-09-19 NOTE — ED Triage Notes (Signed)
C/o discoloration to right LE x 2 months-denies injury-NAD-steady gait

## 2018-09-19 NOTE — ED Provider Notes (Signed)
[facility] EMERGENCY DEPARTMENT Provider Note   CSN: [id number] Arrival date & time: 09/19/18  1338     History   Chief Complaint Chief Complaint  Patient presents with  . Leg Problem    HPI Janet Velasquez is a 28 y.o. female with history of hypertension who presents with a 6228-month history of discoloration to her right thigh.  She reports it initially looked like a bruise.  It does not hurt.  She reports she occasionally has some tingling near her right ankle, but no pain in her calf or pain around the area.  She denies any recent long trips, surgeries, known cancer, new leg pain or swelling.  She reports she had shortness of breath couple months ago and was [facility] it was related to her blood pressure.  She denies any chest pain or pleuritic chest pain.  She denies any history of eczema.  HPI  Past Medical History:  Diagnosis Date  . Hypertension     There are no active problems to display for this patient.   History reviewed. No pertinent surgical history.   OB History   No obstetric history on file.      Home Medications    Prior to Admission medications   Medication Sig Start Date End Date Taking? Authorizing Provider  aspirin 81 MG tablet Take 81 mg by mouth daily.    [provider]  hydrochlorothiazide (HYDRODIURIL) 12.5 MG tablet Take 1 tablet (12.5 mg total) by mouth daily. 09/19/18   Randie Bloodgood, Waylan BogaAlexandra M, PA-C  naproxen (NAPROSYN) 500 MG tablet Take 1 tablet (500 mg total) by mouth 2 (two) times daily. Patient not taking: Reported on 01/01/2018 11/04/17   Dietrich PatesKhatri, [place], PA-C    Family History No family history on file.  Social History Social History   Tobacco Use  . Smoking status: Never Smoker  . Smokeless tobacco: Never Used  Substance Use Topics  . Alcohol use: Yes    Comment: occ  . Drug use: Not Currently     Allergies   Bee venom   Review of Systems Review of Systems  Constitutional: Negative for fever.    Respiratory: Negative for shortness of breath.   Cardiovascular: Negative for chest pain and leg swelling.  Skin: Positive for color change and rash.     Physical Exam Updated Vital Signs BP (!) 158/118   Pulse 73   Temp 98 F (36.7 C) (Oral)   Resp 20   Ht 5\' 11"  (1.803 m)   Wt 127.5 kg   SpO2 96%   BMI 39.19 kg/m   Physical Exam Vitals signs and nursing note reviewed.  Constitutional:      General: She is not in acute distress.    Appearance: She is well-developed. She is not diaphoretic.  HENT:     Head: Normocephalic and atraumatic.     Mouth/Throat:     Pharynx: No oropharyngeal exudate.  Eyes:     General: No scleral icterus.       Right eye: No discharge.        Left eye: No discharge.     Conjunctiva/sclera: Conjunctivae normal.     Pupils: Pupils are equal, round, and reactive to light.  Neck:     Musculoskeletal: Normal range of motion and neck supple.     Thyroid: No thyromegaly.  Cardiovascular:     Rate and Rhythm: Normal rate and regular rhythm.     Heart sounds: Normal heart sounds. No murmur.  No friction rub. No gallop.   Pulmonary:     Effort: Pulmonary effort is normal. No respiratory distress.     Breath sounds: Normal breath sounds. No stridor. No wheezing or rales.  Abdominal:     General: Bowel sounds are normal. There is no distension.     Palpations: Abdomen is soft.     Tenderness: There is no abdominal tenderness. There is no guarding or rebound.  Lymphadenopathy:     Cervical: No cervical adenopathy.  Skin:    General: Skin is warm and dry.     Coloration: Skin is not pale.     Findings: No rash.     Comments: Ecchymotic area to the right thigh with overlying scaling patch, nontender, measuring about 2 x 5 cm; see photo  Neurological:     Mental Status: She is alert.     Coordination: Coordination normal.        ED Treatments / Results  Labs (all labs ordered are listed, but only abnormal results are displayed) Labs  Reviewed - No data to display  EKG None  Radiology [place] Venous Img Lower Unilateral Right  Result Date: 09/19/2018 CLINICAL DATA:  28 y/o F; area of redness/bruising of the right lateral mid thigh for 2 months. No known trauma. EXAM: RIGHT LOWER EXTREMITY VENOUS DOPPLER ULTRASOUND TECHNIQUE: Gray-scale sonography with graded compression, as well as color Doppler and duplex ultrasound were performed to evaluate the lower extremity deep venous systems from the level of the common femoral vein and including the common femoral, femoral, profunda femoral, popliteal and calf veins including the posterior tibial, peroneal and gastrocnemius veins when visible. The superficial great saphenous vein was also interrogated. Spectral Doppler was utilized to evaluate flow at rest and with distal augmentation maneuvers in the common femoral, femoral and popliteal veins. COMPARISON:  None. FINDINGS: Contralateral Common Femoral Vein: Respiratory phasicity is normal and symmetric with the symptomatic side. No evidence of thrombus. Normal compressibility. Common Femoral Vein: No evidence of thrombus. Normal compressibility, respiratory phasicity and response to augmentation. Saphenofemoral Junction: No evidence of thrombus. Normal compressibility and flow on color Doppler imaging. Profunda Femoral Vein: No evidence of thrombus. Normal compressibility and flow on color Doppler imaging. Femoral Vein: No evidence of thrombus. Normal compressibility, respiratory phasicity and response to augmentation. Popliteal Vein: No evidence of thrombus. Normal compressibility, respiratory phasicity and response to augmentation. Calf Veins: No evidence of thrombus. Normal compressibility and flow on color Doppler imaging. Superficial Great Saphenous Vein: No evidence of thrombus. Normal compressibility. Venous Reflux:  None. Other Findings:  None. IMPRESSION: No evidence of deep venous thrombosis. Electronically Signed   By: Mitzi HansenLance   Furusawa-Stratton M.D.   On: 09/19/2018 18:47   [place] Rt Lower Extrem Ltd Soft Tissue Non Vascular  Result Date: 09/19/2018 CLINICAL DATA:  28 y/o F; area of redness/bruising of the right lateral mid thigh for 2 months. No known trauma. EXAM: ULTRASOUND RIGHT LOWER EXTREMITY LIMITED TECHNIQUE: Ultrasound examination of the lower extremity soft tissues was performed in the area of clinical concern. COMPARISON:  None. FINDINGS: Grayscale and color Doppler evaluation of the area of interest in the mid lateral thigh. Muscles: Normal. Other Soft Tissue Structures: Normal. IMPRESSION: Normal soft tissue ultrasound.  No mass or fluid collection. Electronically Signed   By: Mitzi HansenLance  Furusawa-Stratton M.D.   On: 09/19/2018 18:41    Procedures Procedures (including critical care time)  Medications Ordered in ED Medications  hydrochlorothiazide (HYDRODIURIL) tablet 12.5 mg (12.5 mg Oral Given 09/19/18 1834)  Initial Impression / Assessment and Plan / ED Course  I have reviewed the triage vital signs and the nursing notes.  Pertinent labs & imaging results that were available during my care of the patient were reviewed by me and considered in my medical decision making (see chart for details).     Patient with ecchymotic area to right outer thigh.  Considering spontaneous appearance, DVT ultrasound was conducted to rule out underlying DVT causing the area of which was negative.  Ultrasound soft tissue also negative.  Will refer to PCP for further evaluation if the area is not improving with ice.  Patient given refill of her HCTZ.  Patient given a dose prior to discharge.  Return precautions discussed.  Patient understands and agrees with plan.  Patient vitals stable and discharged in satisfactory condition.  Final Clinical Impressions(s) / ED Diagnoses   Final diagnoses:  Rash and nonspecific skin eruption    ED Discharge Orders         Ordered    hydrochlorothiazide (HYDRODIURIL) 12.5 MG tablet   Daily     09/19/18 1904           Emi Holes, [place] 09/20/18 0046    Terrilee Files, MD 09/22/18 1451

## 2018-10-29 ENCOUNTER — Emergency Department (HOSPITAL_COMMUNITY)
Admission: EM | Admit: 2018-10-29 | Discharge: 2018-10-29 | Disposition: A | Discharge status: Home / Self Care | Payer: Private Health Insurance - Indemnity | Attending: Emergency Medicine | Admitting: Emergency Medicine

## 2018-10-29 ENCOUNTER — Other Ambulatory Visit: Payer: Self-pay

## 2018-10-29 ENCOUNTER — Emergency Department (HOSPITAL_COMMUNITY): Payer: Private Health Insurance - Indemnity

## 2018-10-29 ENCOUNTER — Encounter (HOSPITAL_COMMUNITY): Payer: Self-pay

## 2018-10-29 DIAGNOSIS — Z79899 Other long term (current) drug therapy: Secondary | ICD-10-CM | POA: Diagnosis not present

## 2018-10-29 DIAGNOSIS — R0602 Shortness of breath: Secondary | ICD-10-CM | POA: Diagnosis not present

## 2018-10-29 NOTE — ED Triage Notes (Signed)
Patient states she was walking at work last night and became SOB and began having right mid back pain. Patient states when she lays flat she begins having palpitations. Patient states pa;pitations worse than when she has had them before.

## 2018-10-29 NOTE — ED Notes (Signed)
Pt called to be roomed x2  With no response.  RN notified.

## 2018-10-29 NOTE — Progress Notes (Signed)
X-ray called for patient in lobby a few times at 2:22 pm with no response.

## 2018-12-15 IMAGING — CR DG CHEST 2V
2 series · 2 of 2 positions shown · non-contrast
Comparison: Chest radiograph 12/30/2015

CLINICAL DATA: Chest tightness

EXAM:
CHEST  2 VIEW

[w chest pa]
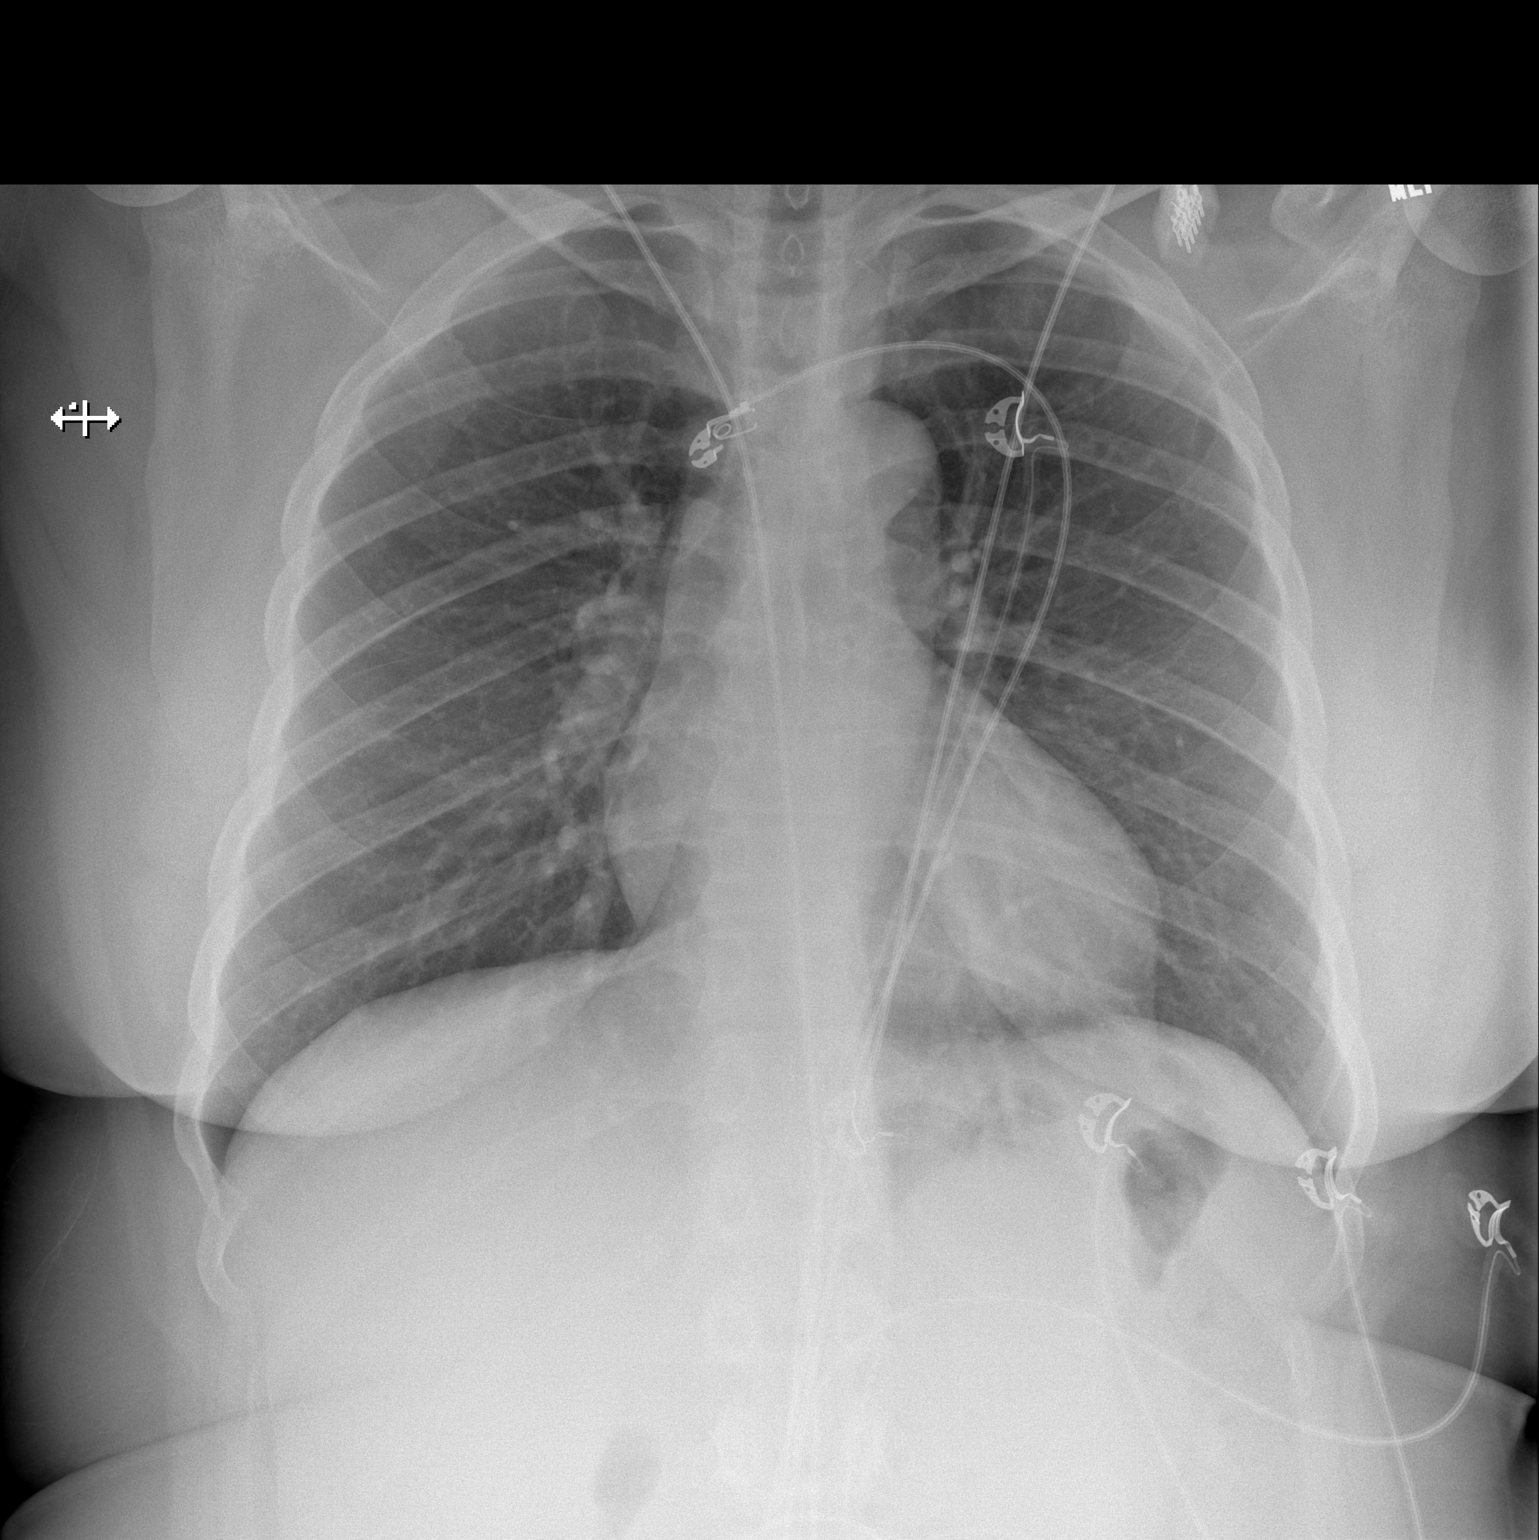

[w chest lat]
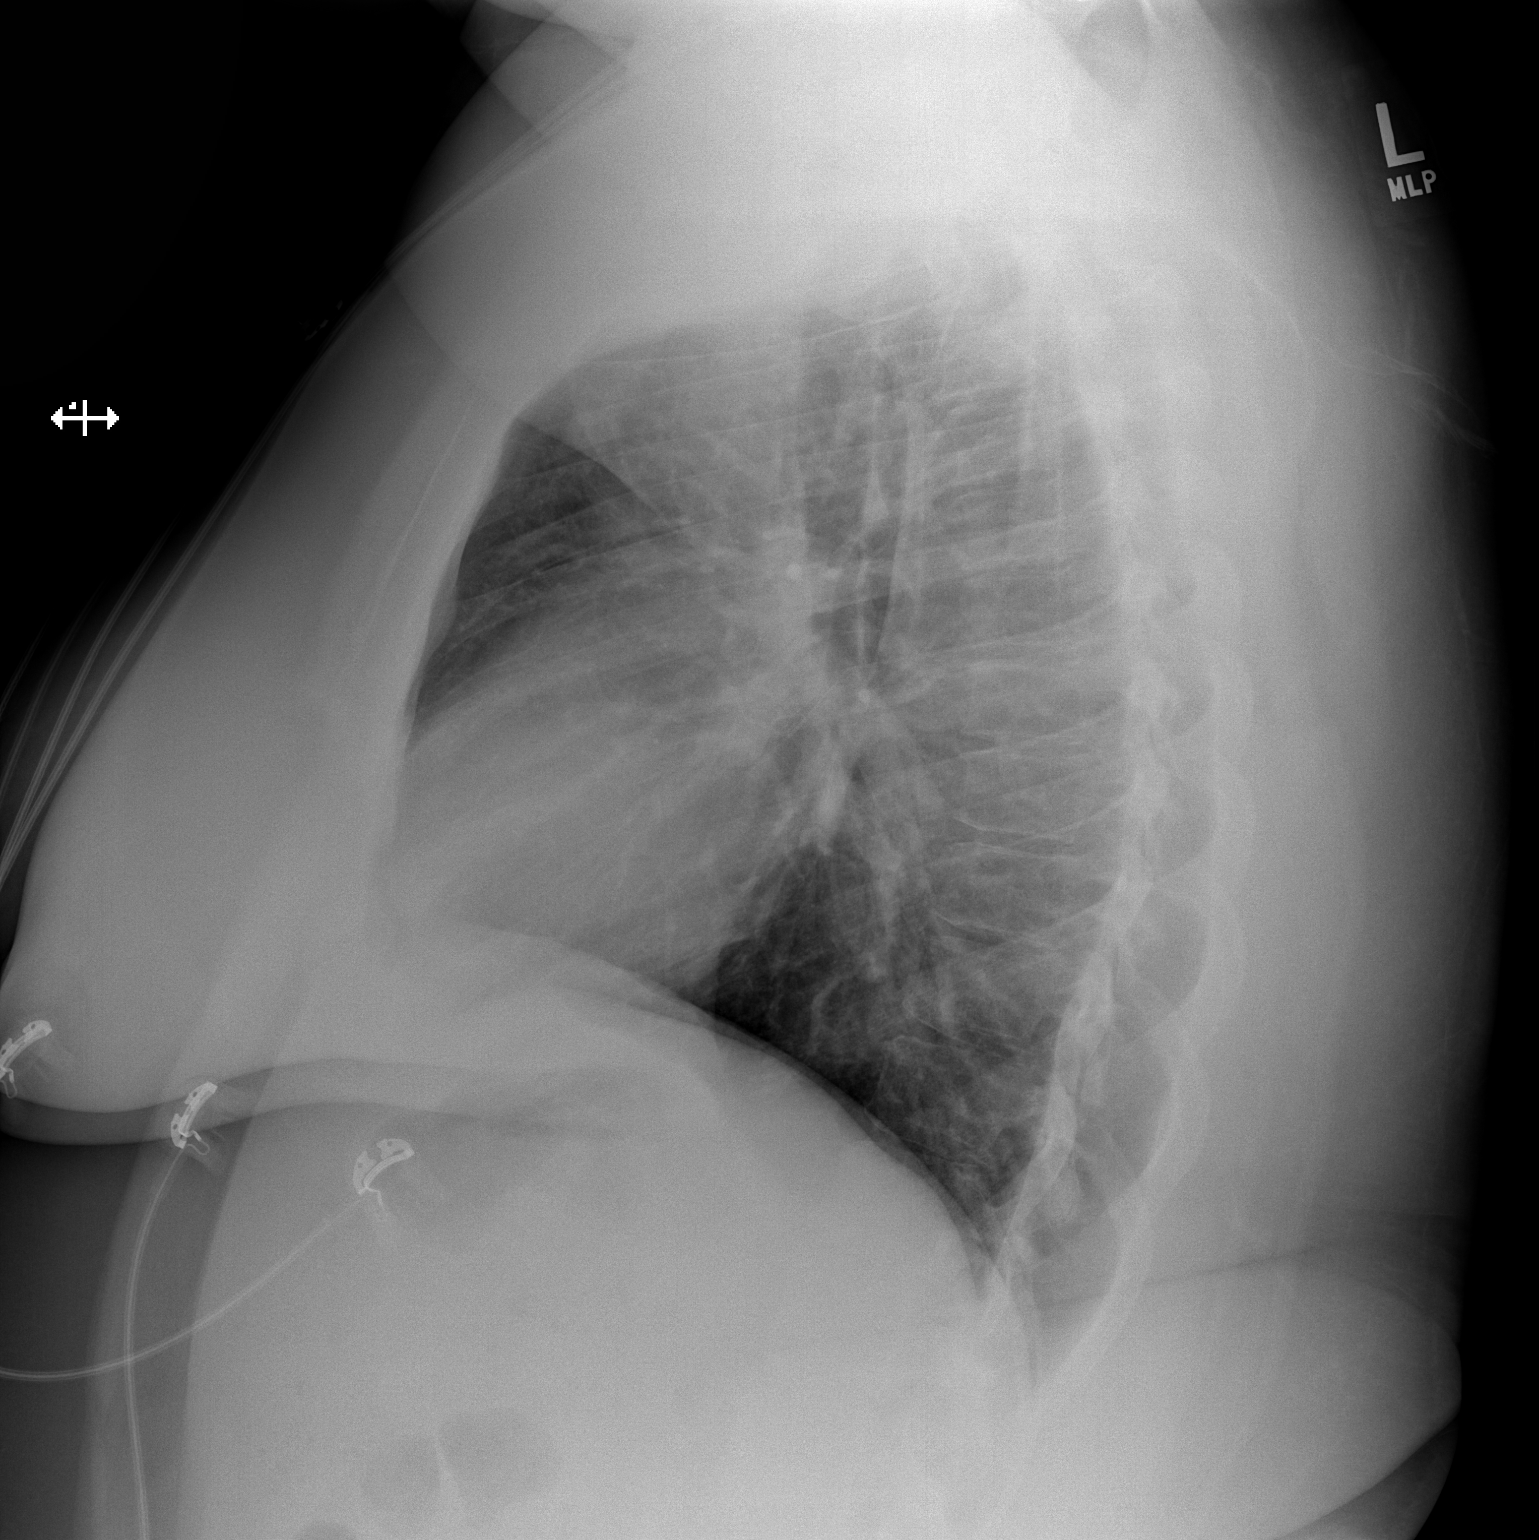

[2 of 2 positions shown; findings below may reference images not displayed]

FINDINGS: Monitoring leads overlie the patient. Stable cardiac and mediastinal
contours. No consolidative pulmonary opacities. No pleural effusion
or pneumothorax.
IMPRESSION: No acute cardiopulmonary process.

## 2019-01-06 ENCOUNTER — Other Ambulatory Visit (HOSPITAL_COMMUNITY)
Admission: RE | Admit: 2019-01-06 | Discharge: 2019-01-06 | Disposition: A | Discharge status: Home / Self Care | Payer: 59 | Source: Ambulatory Visit | Attending: Family Medicine | Admitting: Family Medicine

## 2019-01-06 ENCOUNTER — Other Ambulatory Visit: Payer: Self-pay | Admitting: Family Medicine

## 2019-01-06 DIAGNOSIS — Z124 Encounter for screening for malignant neoplasm of cervix: Secondary | ICD-10-CM | POA: Insufficient documentation

## 2019-01-08 LAB — CYTOLOGY - PAP
Chlamydia: NEGATIVE
Diagnosis: UNDETERMINED — AB
Neisseria Gonorrhea: NEGATIVE

## 2019-07-06 ENCOUNTER — Encounter (HOSPITAL_COMMUNITY): Payer: Self-pay

## 2019-07-06 ENCOUNTER — Other Ambulatory Visit: Payer: Self-pay

## 2019-07-06 ENCOUNTER — Emergency Department (HOSPITAL_COMMUNITY)
Admission: EM | Admit: 2019-07-06 | Discharge: 2019-07-07 | Disposition: A | Discharge status: Home / Self Care | Payer: 59 | Attending: Emergency Medicine | Admitting: Emergency Medicine

## 2019-07-06 DIAGNOSIS — I10 Essential (primary) hypertension: Secondary | ICD-10-CM | POA: Insufficient documentation

## 2019-07-06 DIAGNOSIS — M25511 Pain in right shoulder: Secondary | ICD-10-CM | POA: Insufficient documentation

## 2019-07-06 DIAGNOSIS — Z7982 Long term (current) use of aspirin: Secondary | ICD-10-CM | POA: Insufficient documentation

## 2019-07-06 DIAGNOSIS — Z79899 Other long term (current) drug therapy: Secondary | ICD-10-CM | POA: Insufficient documentation

## 2019-07-06 DIAGNOSIS — Y93I9 Activity, other involving external motion: Secondary | ICD-10-CM | POA: Insufficient documentation

## 2019-07-06 DIAGNOSIS — M7918 Myalgia, other site: Secondary | ICD-10-CM

## 2019-07-06 DIAGNOSIS — Y92410 Unspecified street and highway as the place of occurrence of the external cause: Secondary | ICD-10-CM | POA: Insufficient documentation

## 2019-07-06 DIAGNOSIS — Y999 Unspecified external cause status: Secondary | ICD-10-CM | POA: Insufficient documentation
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
## 2019-07-07 MED ORDER — CYCLOBENZAPRINE HCL 10 MG PO TABS: 10.0000 mg | ORAL_TABLET | Freq: Two times a day (BID) | ORAL | 0 refills | Status: DC | PRN

## 2019-07-07 MED ORDER — IBUPROFEN 600 MG PO TABS: 600.0000 mg | ORAL_TABLET | Freq: Four times a day (QID) | ORAL | 0 refills | Status: DC | PRN
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
## 2019-07-07 NOTE — Discharge Instructions (Signed)
Take medications as prescribed. Use cool compresses to the sore areas for the first 48 hours, after which you can use heat.   Follow up with your doctor as needed. Return to the emergency department with any new or worsening symptoms.

## 2019-07-15 ENCOUNTER — Other Ambulatory Visit: Payer: Self-pay

## 2019-07-15 ENCOUNTER — Emergency Department (HOSPITAL_COMMUNITY)
Admission: EM | Admit: 2019-07-15 | Discharge: 2019-07-15 | Disposition: A | Discharge status: Home / Self Care | Payer: Self-pay | Attending: Emergency Medicine | Admitting: Emergency Medicine

## 2019-07-15 ENCOUNTER — Encounter (HOSPITAL_COMMUNITY): Payer: Self-pay | Admitting: Emergency Medicine

## 2019-07-15 DIAGNOSIS — R42 Dizziness and giddiness: Secondary | ICD-10-CM | POA: Insufficient documentation

## 2019-07-15 DIAGNOSIS — Y92410 Unspecified street and highway as the place of occurrence of the external cause: Secondary | ICD-10-CM | POA: Insufficient documentation

## 2019-07-15 DIAGNOSIS — R519 Headache, unspecified: Secondary | ICD-10-CM | POA: Insufficient documentation

## 2019-07-15 DIAGNOSIS — Y998 Other external cause status: Secondary | ICD-10-CM | POA: Insufficient documentation

## 2019-07-15 DIAGNOSIS — I10 Essential (primary) hypertension: Secondary | ICD-10-CM | POA: Insufficient documentation

## 2019-07-15 DIAGNOSIS — S161XXA Strain of muscle, fascia and tendon at neck level, initial encounter: Secondary | ICD-10-CM | POA: Insufficient documentation

## 2019-07-15 DIAGNOSIS — Y9389 Activity, other specified: Secondary | ICD-10-CM | POA: Insufficient documentation

## 2019-07-15 MED ORDER — CYCLOBENZAPRINE HCL 10 MG PO TABS: 10.0000 mg | ORAL_TABLET | Freq: Two times a day (BID) | ORAL | 0 refills | Status: AC | PRN

## 2019-07-15 MED ORDER — IBUPROFEN 600 MG PO TABS: 600.0000 mg | ORAL_TABLET | Freq: Four times a day (QID) | ORAL | 0 refills | Status: AC | PRN

## 2019-07-15 NOTE — Discharge Instructions (Signed)
Take the medications as prescribed, follow up with your primary care doctor if the symptoms persist

## 2019-07-15 NOTE — ED Notes (Signed)
Patient said she would like to leave and is refusing her scans. She has missed her first job and does not want to miss her second job. MD notified.
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
## 2019-07-16 IMAGING — CR DG CHEST 2V
2 series · 2 of 2 positions shown · non-contrast
Comparison: 02/03/2018

CLINICAL DATA: Shortness of breath, hypertension

EXAM:
CHEST - 2 VIEW

[w chest pa]
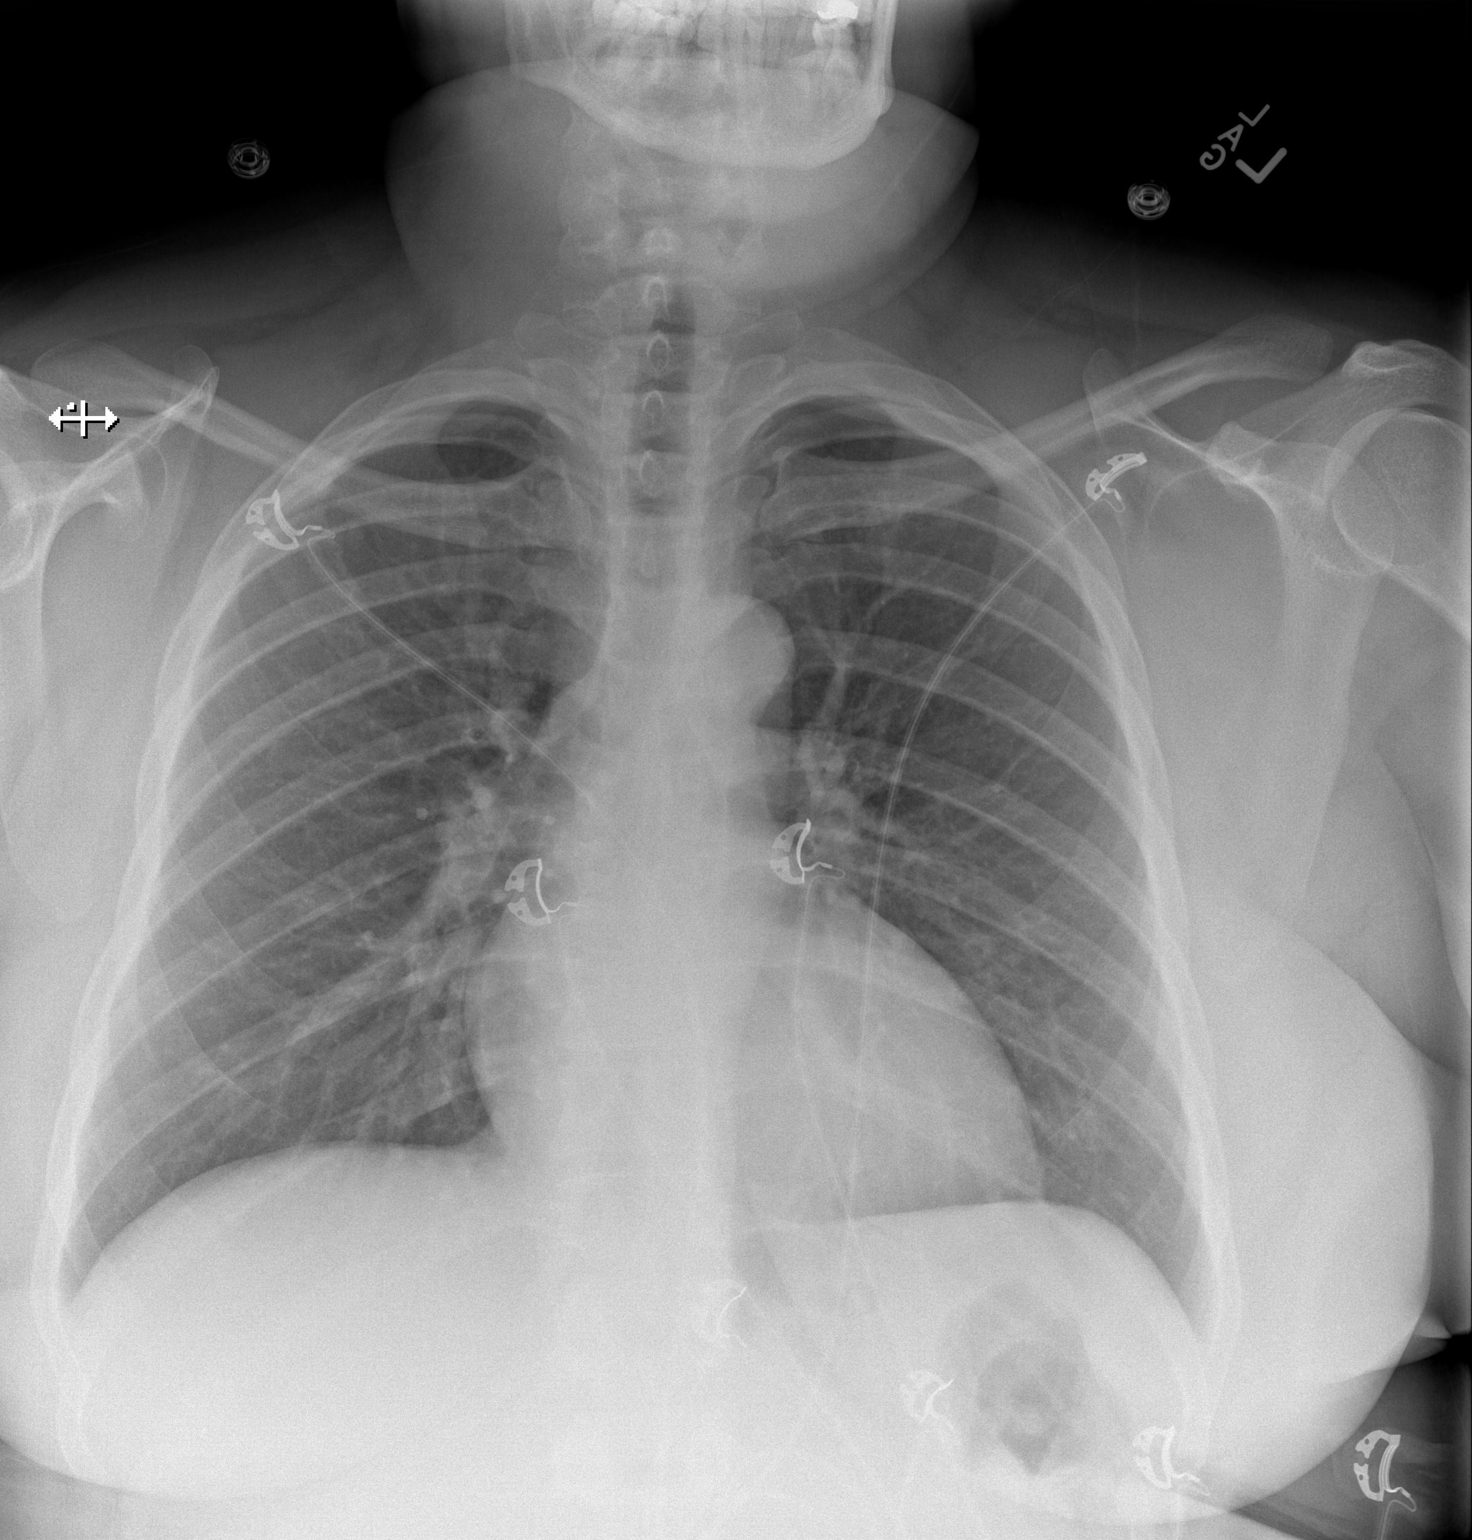

[w chest lat]
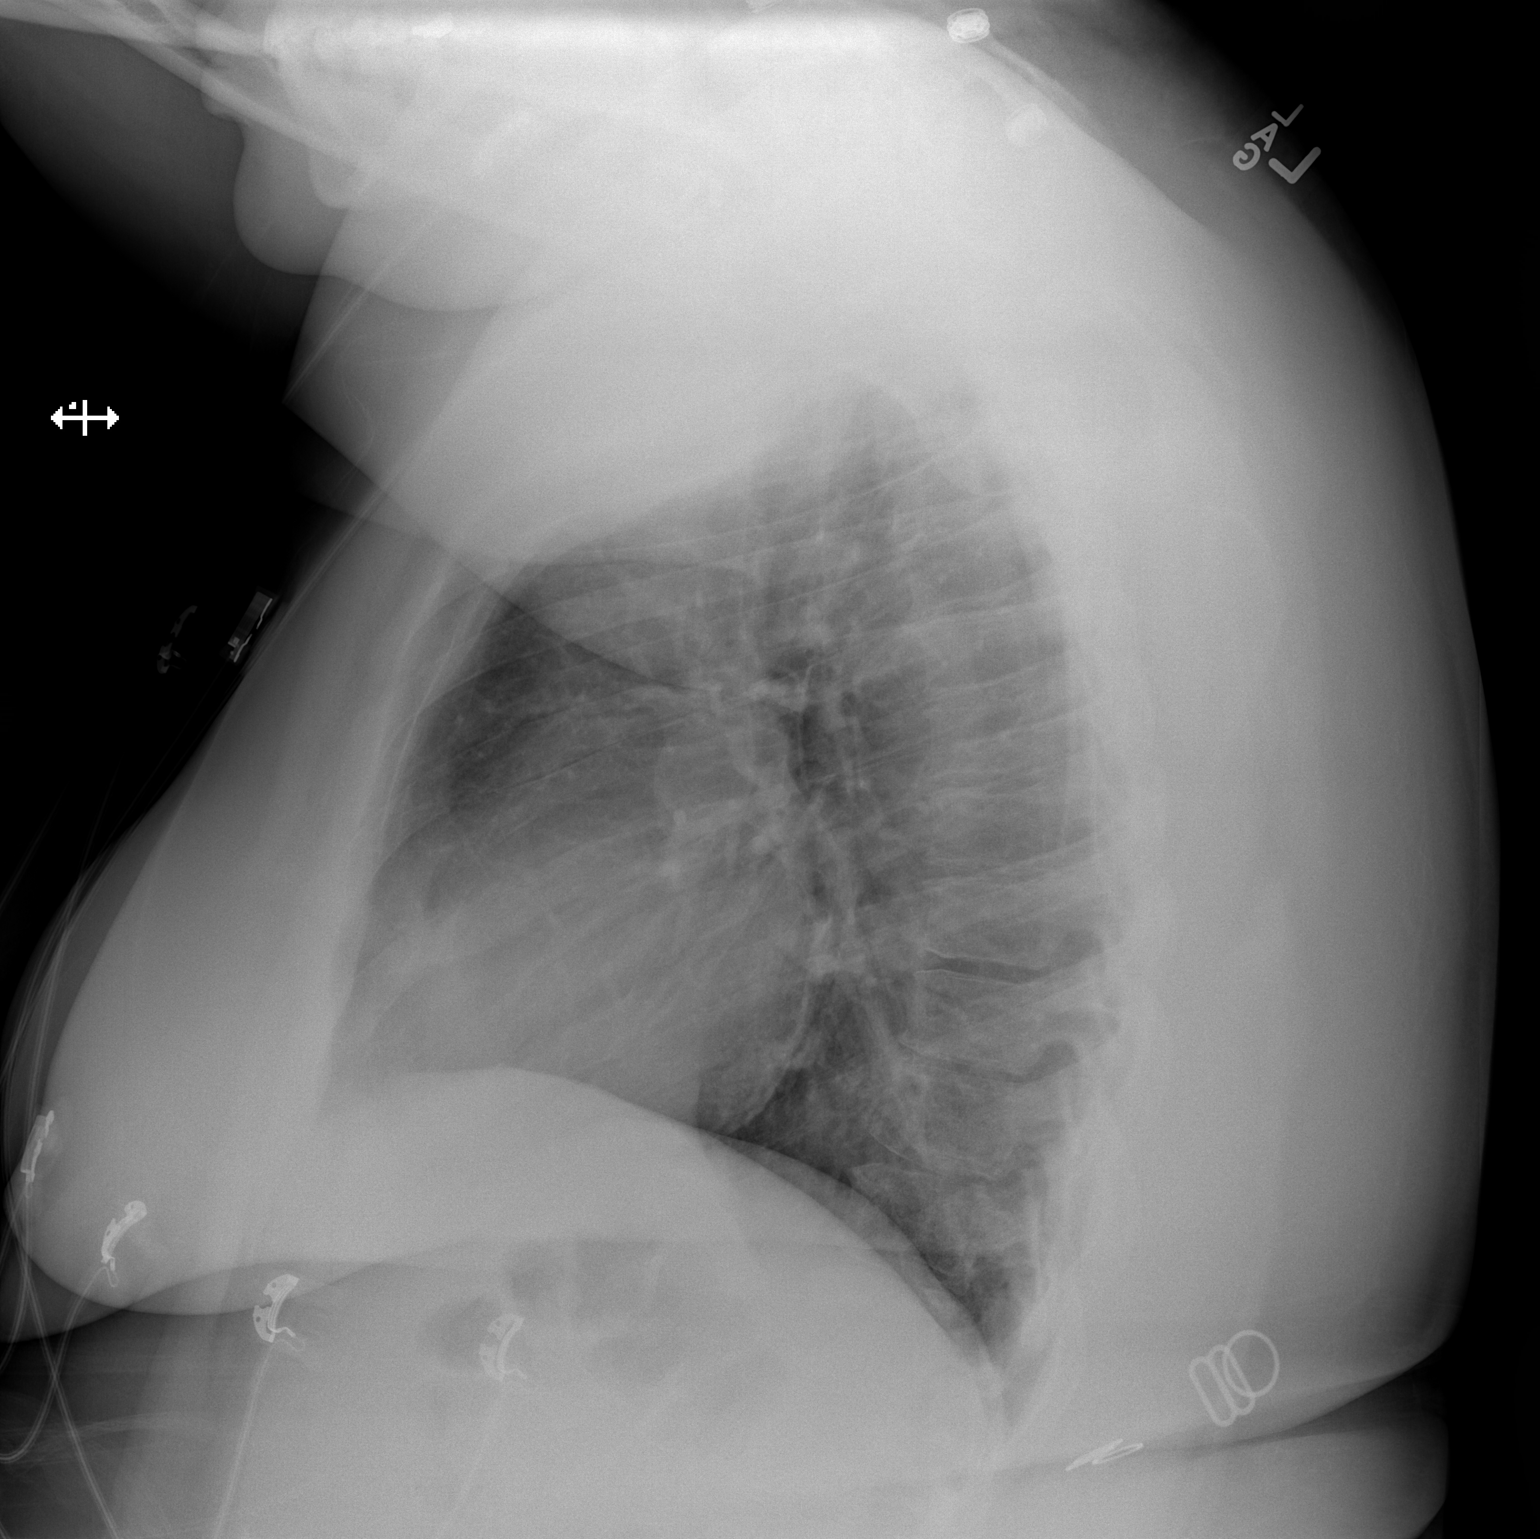

[2 of 2 positions shown; findings below may reference images not displayed]

FINDINGS: Lungs are clear.  No pleural effusion or pneumothorax.

The heart is normal in size.

Visualized osseous structures are within normal limits.
IMPRESSION: Normal chest radiographs.

## 2020-02-01 ENCOUNTER — Other Ambulatory Visit: Payer: Self-pay

## 2020-02-01 ENCOUNTER — Encounter (HOSPITAL_COMMUNITY): Payer: Self-pay | Admitting: Emergency Medicine

## 2020-02-01 ENCOUNTER — Emergency Department (HOSPITAL_COMMUNITY)
Admission: EM | Admit: 2020-02-01 | Discharge: 2020-02-01 | Disposition: A | Discharge status: Home / Self Care | Payer: Self-pay | Attending: Emergency Medicine | Admitting: Emergency Medicine

## 2020-02-01 DIAGNOSIS — N939 Abnormal uterine and vaginal bleeding, unspecified: Secondary | ICD-10-CM | POA: Insufficient documentation

## 2020-02-01 DIAGNOSIS — I10 Essential (primary) hypertension: Secondary | ICD-10-CM | POA: Insufficient documentation

## 2020-02-01 DIAGNOSIS — Z79899 Other long term (current) drug therapy: Secondary | ICD-10-CM | POA: Insufficient documentation

## 2020-02-01 LAB — CBC
HCT: 32.5 % — ABNORMAL LOW (ref 36.0–46.0)
Hemoglobin: 10.6 g/dL — ABNORMAL LOW (ref 12.0–15.0)
MCH: 29.4 pg (ref 26.0–34.0)
MCHC: 32.6 g/dL (ref 30.0–36.0)
MCV: 90.3 fL (ref 80.0–100.0)
Platelets: 388 10*3/uL (ref 150–400)
RBC: 3.6 MIL/uL — ABNORMAL LOW (ref 3.87–5.11)
RDW: 14 % (ref 11.5–15.5)
WBC: 9.6 10*3/uL (ref 4.0–10.5)
nRBC: 0 % (ref 0.0–0.2)

## 2020-02-01 LAB — BASIC METABOLIC PANEL
Anion gap: 10 (ref 5–15)
BUN: 8 mg/dL (ref 6–20)
CO2: 25 mmol/L (ref 22–32)
Calcium: 8.5 mg/dL — ABNORMAL LOW (ref 8.9–10.3)
Chloride: 104 mmol/L (ref 98–111)
Creatinine, Ser: 0.87 mg/dL (ref 0.44–1.00)
GFR calc Af Amer: >60 mL/min (ref >60)
GFR calc non Af Amer: >60 mL/min (ref >60)
Glucose, Bld: 107 mg/dL — ABNORMAL HIGH (ref 70–99)
Potassium: 4.6 mmol/L (ref 3.5–5.1)
Sodium: 139 mmol/L (ref 135–145)

## 2020-02-01 LAB — I-STAT BETA HCG BLOOD, ED (MC, WL, AP ONLY): I-stat hCG, quantitative: <5 m[IU]/mL (ref <5)

## 2020-02-01 LAB — WET PREP, GENITAL
Clue Cells Wet Prep HPF POC: NONE SEEN
Sperm: NONE SEEN
Yeast Wet Prep HPF POC: NONE SEEN

## 2020-02-01 LAB — TYPE AND SCREEN
ABO/RH(D): A NEG
Antibody Screen: NEGATIVE

## 2020-02-01 MED ORDER — TRANEXAMIC ACID 650 MG PO TABS
1300.0000 mg | ORAL_TABLET | Freq: Once | ORAL | Status: AC
  Administered 2020-02-01: 1300 mg via ORAL
  Filled 2020-02-01: qty 2

## 2020-02-01 MED ORDER — TRANEXAMIC ACID 650 MG PO TABS: 1300.0000 mg | ORAL_TABLET | Freq: Three times a day (TID) | ORAL | 0 refills | Status: AC

## 2020-02-01 MED ORDER — FERROUS SULFATE 325 (65 FE) MG PO TABS: 325.0000 mg | ORAL_TABLET | Freq: Every day | ORAL | 0 refills | Status: AC

## 2020-02-01 NOTE — Discharge Instructions (Addendum)
Start taking the medications as prescribed.  Follow-up with your OB/GYN doctor for further treatment.  Return as needed for worsening symptoms.

## 2020-02-01 NOTE — ED Provider Notes (Signed)
[facility] DEPT Provider Note   CSN: [id number] Arrival date & time: 02/01/20  1522     History Chief Complaint  Patient presents with  . Vaginal Bleeding    Janet Velasquez is a 29 y.o. female.  HPI   Patient presents ED for evaluation of vaginal bleeding.  Patient states she has a history of irregular and heavy menstrual bleeding.  She has seen an OB/GYN doctor in the past but never followed up as instructed.  Patient states she started having heavy bleeding yesterday.  She has been passing clots.   She went through 4 pads yesterday and 4 pads today.  She denies any fevers or chills.  No lightheadedness.  No syncope.  Past Medical History:  Diagnosis Date  . Hypertension     There are no problems to display for this patient.   History reviewed. No pertinent surgical history.   OB History   No obstetric history on file.     Family History  Problem Relation Age of Onset  . Diabetes Mother   . Hypertension Mother   . Diabetes Father   . Hypertension Father     Social History   Tobacco Use  . Smoking status: Never Smoker  . Smokeless tobacco: Never Used  Substance Use Topics  . Alcohol use: Yes    Comment: occ  . Drug use: Not Currently    Home Medications Prior to Admission medications   Medication Sig Start Date End Date Taking? Authorizing Provider  cyclobenzaprine (FLEXERIL) 10 MG tablet Take 1 tablet (10 mg total) by mouth 2 (two) times daily as needed for muscle spasms. 07/15/19   Dorie Rank, MD  ferrous sulfate 325 (65 FE) MG tablet Take 1 tablet (325 mg total) by mouth daily. 02/01/20   Dorie Rank, MD  hydrochlorothiazide (HYDRODIURIL) 12.5 MG tablet Take 1 tablet (12.5 mg total) by mouth daily. 09/19/18   Law, Bea Graff, PA-C  ibuprofen (ADVIL) 600 MG tablet Take 1 tablet (600 mg total) by mouth every 6 (six) hours as needed. 07/15/19   Dorie Rank, MD  lisinopril-hydrochlorothiazide (ZESTORETIC) 10-12.5 MG tablet Take 1  tablet by mouth daily. 06/08/19   [provider]  naproxen (NAPROSYN) 500 MG tablet Take 1 tablet (500 mg total) by mouth 2 (two) times daily. Patient not taking: Reported on 01/01/2018 11/04/17   Delia Heady, PA-C  tranexamic acid (LYSTEDA) 650 MG TABS tablet Take 2 tablets (1,300 mg total) by mouth 3 (three) times daily for 5 days. 02/01/20 02/06/20  Dorie Rank, MD    Allergies    Bee venom  Review of Systems   Review of Systems  All other systems reviewed and are negative.   Physical Exam Updated Vital Signs BP (!) 147/116 (BP Location: Right Arm)   Pulse 93   Temp 98.2 F (36.8 C)   Resp 19   SpO2 100%   Physical Exam Vitals and nursing note reviewed. Exam conducted with a chaperone present.  Constitutional:      General: She is not in acute distress.    Appearance: She is well-developed.  HENT:     Head: Normocephalic and atraumatic.     Right Ear: External ear normal.     Left Ear: External ear normal.  Eyes:     General: No scleral icterus.       Right eye: No discharge.        Left eye: No discharge.     Conjunctiva/sclera: Conjunctivae normal.  Neck:  Trachea: No tracheal deviation.  Cardiovascular:     Rate and Rhythm: Normal rate and regular rhythm.  Pulmonary:     Effort: Pulmonary effort is normal. No respiratory distress.     Breath sounds: Normal breath sounds. No stridor. No wheezing or rales.  Abdominal:     General: Bowel sounds are normal. There is no distension.     Palpations: Abdomen is soft.     Tenderness: There is no abdominal tenderness. There is no guarding or rebound.  Genitourinary:    General: Normal vulva.     Vagina: Bleeding present.     Cervix: Cervical bleeding present.     Uterus: Normal.      Adnexa: Right adnexa normal and left adnexa normal.  Musculoskeletal:        General: No tenderness.     Cervical back: Neck supple.  Skin:    General: Skin is warm and dry.     Findings: No rash.  Neurological:     Mental  Status: She is alert.     Cranial Nerves: No cranial nerve deficit (no facial droop, extraocular movements intact, no slurred speech).     Sensory: No sensory deficit.     Motor: No abnormal muscle tone or seizure activity.     Coordination: Coordination normal.     ED Results / Procedures / Treatments   Labs (all labs ordered are listed, but only abnormal results are displayed) Labs Reviewed  WET PREP, GENITAL - Abnormal; Notable for the following components:      Result Value   Trich, Wet Prep PRESENT (*)    WBC, Wet Prep HPF POC FEW (*)    All other components within normal limits  CBC - Abnormal; Notable for the following components:   RBC 3.60 (*)    Hemoglobin 10.6 (*)    HCT 32.5 (*)    All other components within normal limits  BASIC METABOLIC PANEL - Abnormal; Notable for the following components:   Glucose, Bld 107 (*)    Calcium 8.5 (*)    All other components within normal limits  URINALYSIS, ROUTINE W REFLEX MICROSCOPIC  I-STAT BETA HCG BLOOD, ED ([facility], [facility], [facility] ONLY)  TYPE AND SCREEN  ABO/RH  GC/CHLAMYDIA PROBE AMP ([place]) NOT AT [facility]    EKG None  Radiology No results found.  Procedures Procedures (including critical care time)  Medications Ordered in ED Medications  tranexamic acid (LYSTEDA) tablet 1,300 mg (has no administration in time range)    ED Course  I have reviewed the triage vital signs and the nursing notes.  Pertinent labs & imaging results that were available during my care of the patient were reviewed by me and considered in my medical decision making (see chart for details).  Clinical Course as of Feb 01 2027  Mon Feb 01, 2020  1919 Hemoglobin is decreased compared to previous values.   [JK]  1919 Metabolic panel unremarkable.   [JK]  1927 Patient has seen Dr. Richardson Dopp before   [JK]  1933 D/w DR Sallye Ober.  Recc TXA.  1300 mg q8 hr for 5 days   [JK]    Clinical Course User Index [JK] Linwood Dibbles, MD   MDM Rules/Calculators/A&P                       Patient presents with abnormal vaginal bleeding.  She is not pregnant.  Patient does have anemia but does not require any acute treatment.  She  does have some oozing but no heavy vaginal bleeding on pelvic exam.  I discussed the case with Dr. Sallye Ober and we will start the patient on TXA.  I also give her prescription for iron and have her follow-up with her OB/GYN doctor. Final Clinical Impression(s) / ED Diagnoses Final diagnoses:  Vaginal bleeding    Rx / DC Orders ED Discharge Orders         Ordered    tranexamic acid (LYSTEDA) 650 MG TABS tablet  3 times daily     02/01/20 2026    ferrous sulfate 325 (65 FE) MG tablet  Daily     02/01/20 2026           Linwood Dibbles, MD 02/01/20 2029

## 2020-02-01 NOTE — ED Triage Notes (Signed)
Pt c/o heavy vaginal bleeding with lots of clots since yesterday. Went through 4 pads yesterday and 4 pads today.

## 2020-02-01 NOTE — ED Notes (Signed)
Patient was verbalized discharge instructions. Awaiting medication from pharmacy and then the patient will be discharged after.

## 2020-02-02 LAB — GC/CHLAMYDIA PROBE AMP (~~LOC~~) NOT AT ARMC
Chlamydia: NEGATIVE
Comment: NEGATIVE
Comment: NORMAL
Neisseria Gonorrhea: NEGATIVE

## 2020-02-02 LAB — ABO/RH: ABO/RH(D): A NEG
